# Patient Record
Sex: Female | Born: 1980 | Race: White | Hispanic: No | Marital: Married | Smoking: Never smoker
Health system: Southern US, Community
[De-identification: ages and names within clinical notes are randomized; demographics above are authoritative.]

## PROBLEM LIST (undated history)

## (undated) DIAGNOSIS — K921 Melena: Secondary | ICD-10-CM

## (undated) DIAGNOSIS — F32A Depression, unspecified: Secondary | ICD-10-CM

## (undated) DIAGNOSIS — Z820 Family history of epilepsy and other diseases of the nervous system: Secondary | ICD-10-CM

## (undated) DIAGNOSIS — F329 Major depressive disorder, single episode, unspecified: Secondary | ICD-10-CM

## (undated) DIAGNOSIS — F419 Anxiety disorder, unspecified: Secondary | ICD-10-CM

## (undated) DIAGNOSIS — N898 Other specified noninflammatory disorders of vagina: Secondary | ICD-10-CM

## (undated) HISTORY — PX: WISDOM TOOTH EXTRACTION: SHX21

## (undated) HISTORY — PX: TYMPANOSTOMY TUBE PLACEMENT: SHX32

## (undated) HISTORY — DX: Anxiety disorder, unspecified: F41.9

## (undated) HISTORY — DX: Family history of epilepsy and other diseases of the nervous system: Z82.0

## (undated) HISTORY — DX: Melena: K92.1

## (undated) HISTORY — DX: Depression, unspecified: F32.A

## (undated) HISTORY — DX: Other specified noninflammatory disorders of vagina: N89.8

## (undated) HISTORY — DX: Major depressive disorder, single episode, unspecified: F32.9

---

## 2009-07-26 DIAGNOSIS — N898 Other specified noninflammatory disorders of vagina: Secondary | ICD-10-CM

## 2009-07-26 DIAGNOSIS — K921 Melena: Secondary | ICD-10-CM

## 2009-07-26 HISTORY — DX: Other specified noninflammatory disorders of vagina: N89.8

## 2009-07-26 HISTORY — DX: Melena: K92.1

## 2009-08-12 ENCOUNTER — Telehealth: Payer: Self-pay | Admitting: Gastroenterology

## 2009-08-13 ENCOUNTER — Ambulatory Visit: Payer: Self-pay | Admitting: Nurse Practitioner

## 2009-08-13 ENCOUNTER — Encounter: Payer: Self-pay | Admitting: Obstetrics & Gynecology

## 2009-08-13 LAB — CONVERTED CEMR LAB
MCHC: 32.9 g/dL (ref 30.0–36.0)
MCV: 81.3 fL (ref 78.0–100.0)
Platelets: 334 10*3/uL (ref 150–400)
RBC: 4.97 M/uL (ref 3.87–5.11)
RDW: 12.6 % (ref 11.5–15.5)

## 2009-08-15 ENCOUNTER — Ambulatory Visit: Payer: Self-pay | Admitting: Obstetrics & Gynecology

## 2009-08-30 ENCOUNTER — Encounter (INDEPENDENT_AMBULATORY_CARE_PROVIDER_SITE_OTHER): Payer: Self-pay | Admitting: *Deleted

## 2009-08-30 ENCOUNTER — Ambulatory Visit: Payer: Self-pay | Admitting: Gastroenterology

## 2009-08-30 DIAGNOSIS — K625 Hemorrhage of anus and rectum: Secondary | ICD-10-CM

## 2009-08-30 DIAGNOSIS — R197 Diarrhea, unspecified: Secondary | ICD-10-CM | POA: Insufficient documentation

## 2009-09-02 ENCOUNTER — Ambulatory Visit: Payer: Self-pay | Admitting: Gastroenterology

## 2009-09-02 ENCOUNTER — Encounter (INDEPENDENT_AMBULATORY_CARE_PROVIDER_SITE_OTHER): Payer: Self-pay | Admitting: *Deleted

## 2009-09-02 LAB — CONVERTED CEMR LAB
ALT: 18 units/L (ref 0–35)
BUN: 10 mg/dL (ref 6–23)
Calcium: 9.4 mg/dL (ref 8.4–10.5)
Creatinine, Ser: 0.7 mg/dL (ref 0.4–1.2)
Eosinophils Relative: 2.2 % (ref 0.0–5.0)
Glucose, Bld: 88 mg/dL (ref 70–99)
Lymphocytes Relative: 25.6 % (ref 12.0–46.0)
MCV: 80 fL (ref 78.0–100.0)
Monocytes Absolute: 0.5 10*3/uL (ref 0.1–1.0)
Neutrophils Relative %: 64.9 % (ref 43.0–77.0)
Platelets: 357 10*3/uL (ref 150.0–400.0)
Potassium: 3.8 meq/L (ref 3.5–5.1)
Sodium: 138 meq/L (ref 135–145)
TSH: 1.51 microintl units/mL (ref 0.35–5.50)
WBC: 8.4 10*3/uL (ref 4.5–10.5)

## 2009-10-15 ENCOUNTER — Ambulatory Visit: Payer: Self-pay | Admitting: Gastroenterology

## 2010-03-11 ENCOUNTER — Ambulatory Visit: Payer: Self-pay | Admitting: Family Medicine

## 2010-04-15 ENCOUNTER — Ambulatory Visit: Payer: Self-pay | Admitting: Family Medicine

## 2010-05-27 NOTE — Progress Notes (Signed)
Summary: triage  Phone Note Call from Patient Call back at [phone]   Caller: Patient Call For: Christella Hartigan Reason for Call: Talk to Nurse Summary of Call: Patient wants to know if she can be seen sooner than her appt 5-13 do to severe constipation and wt loss Initial call taken by: Tawni Levy,  August 12, 2009 12:38 PM  Follow-up for Phone Call        pt is having constipation and bowels are moving everyother day which  is normal for her.  Stools are hard and she has seen some rectal bleeding with the hard stools.   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  These are very painful.  Appt made for 08/30/09 2:45pm Chales Abrahams CMA (AAMA)  August 12, 2009 12:50 PM   Additional Follow-up for Phone Call Additional follow up Details #1::        ok, thanks Additional Follow-up by: Rachael Fee MD,  August 12, 2009 1:46 PM

## 2010-05-27 NOTE — Letter (Signed)
Summary: Appointment Reminder  [facility]  [address] [place], [place] [address]   Phone: [phone]  Fax: [phone]        October 15, 2009 MRN: [id number]    [facility] [address] [place], [place]  [address]    Dear Ms. Ruffino,   We have been unable to reach you by phone to schedule a follow up   appointment that was recommended for you by Dr. ____. It is very   important that we reach you to schedule an appointment. We hope that you  allow [place] to participate in your health care needs. Please contact us at  [phone] at your earliest convenience to schedule your appointment.     Sincerely,    Rachael Fee MD

## 2010-05-27 NOTE — Letter (Signed)
Summary: Appt Reminder 2  [facility]  [address] [place], [place] [address]   Phone: [phone]  Fax: [phone]        Sep 02, 2009 MRN: [id number]    [facility] [address] [place], [place]  [address]    Dear Ms. Pirro,   You have a return appointment with Dr. Christella Hartigan on 10/15/09 at 845 am.  Please remember to bring a complete list of the medicines you are taking, your insurance card and your co-pay.  If you have to cancel or reschedule this appointment, please call before 5:00 pm the evening before to avoid a cancellation fee.  If you have any questions or concerns, please call [phone].    Sincerely,    Chales Abrahams CMA (AAMA)

## 2010-05-27 NOTE — Assessment & Plan Note (Signed)
History of Present Illness Visit Type: Initial Visit Primary GI MD: Rob Bunting MD Primary Provider: Irven Easterly, MD Chief Complaint: blood in stool History of Present Illness:     very pleasant 30 year old woman who has noted blood in her stool.  She has irregularly irregular bowel habits. She saw a proctologist 10 years ago.  She sees blood in stool about every time she has a BM.  Usually has to push and strain to have a BM.  Will have  a BM every 2-3 days.  Blood can be dripping.    definitely had anal fissures, hemorrhoids, even as far back as her teens.  Can have very sore, swollen anus.   Has not tried regular fiber supplements, or stool softners.   Has not tried OTC creams, ointments.  she has noticed unusual lesions on her shins like bruises cannot explain. She has had no eye symptoms. She does have intermittent pains in her belly especially on the right lower quadrant.           Current Medications (verified): 1)  None  Allergies (verified): 1)  ! Iodine  Past History:  Past Medical History: anal fissuring Anemia  Past Surgical History: ear surgeries 3 times C-Section 3 times  Family History: no Crohn's disease  Social History: she is married, she has 3 sons, she is a Architectural technologist, she does not smoke cigarettes or drink alcohol, she drinks about 3 caffeinated beverages per day  Review of Systems       Pertinent positive and negative review of systems were noted in the above HPI and GI specific review of systems.  All other review of systems was otherwise negative.   Vital Signs:  Patient profile:   30 year old female Height:      66 inches Weight:      137.38 pounds BMI:     22.25 Pulse rate:   60 / minute Pulse rhythm:   regular BP sitting:   110 / 60  (left arm) Cuff size:   regular  Vitals Entered By: June McMurray CMA Duncan Dull) (Aug 30, 2009 2:39 PM)  Physical Exam  Additional Exam:  Constitutional: generally well  appearing Psychiatric: alert and oriented times 3 Eyes: extraocular movements intact Mouth: oropharynx moist, no lesions Neck: supple, no lymphadenopathy Cardiovascular: heart regular rate and rythm Lungs: CTA bilaterally Abdomen: soft, non-tender, non-distended, no obvious ascites, no peritoneal signs, normal bowel sounds Extremities: no lower extremity edema bilaterally Skin: no lesions on visible extremities anorectal examination deferred for upcoming colonoscopy   Impression & Recommendations:  Problem # 1:  history of anal fissuring, intermittent rectal bleeding, ?Crohn's she is mildly tender in the right lower quadrant, she has a history of anal fissuring disease, he sees blood with normally every stool. She has had some unusual bruise-like lesions on her shins, I'm suspicious that she may indeed have underlying inflammatory bowel disease such as Crohn's disease. She'll get basic set of labs including a CBC, complete metabolic profile, sedimentation rate. We will arrange for a colonoscopy at her soonest convenience. In the meantime since she tends to be rather constipated have a tough time moving her bowels, she'll try Citrucel on a daily basis.  Other Orders: TLB-CBC Platelet - w/Differential (85025-CBCD) TLB-CMP (Comprehensive Metabolic Pnl) (80053-COMP) TLB-TSH (Thyroid Stimulating Hormone) (84443-TSH) TLB-Sedimentation Rate (ESR) (85652-ESR)  Patient Instructions: 1)  You should begin taking citrucel powder fiber supplement (orange flavor).  Start with a small spoonful and increase this over 1 week  to a full, heaping spoonful daily.  You may notice some bloating when you first start the fiber, but that usually resolves after a few days. 2)  You will be scheduled to have a colonoscopy. 3)  You will get lab test(s) done today (cbc, cmet, tsh, esr). 4)  A copy of this information will be sent to Dr. Thana Ates. 5)  The medication list was reviewed and reconciled.  All changed /  newly prescribed medications were explained.  A complete medication list was provided to the patient / caregiver.  Appended Document: Orders Update/Movi    Clinical Lists Changes  Medications: Added new medication of MOVIPREP 100 GM  SOLR (PEG-KCL-NACL-NASULF-NA ASC-C) As per prep instructions. - Signed Rx of MOVIPREP 100 GM  SOLR (PEG-KCL-NACL-NASULF-NA ASC-C) As per prep instructions.;  #1 x 0;  Signed;  Entered by: Chales Abrahams CMA (AAMA);  Authorized by: Rachael Fee MD;  Method used: [facility]  [address] [place], [place]  [address], Ph: [phone] or [phone], Fax: [phone] Orders: Added new Test order of Colonoscopy (Colon) - Signed    Prescriptions: MOVIPREP 100 GM  SOLR (PEG-KCL-NACL-NASULF-NA ASC-C) As per prep instructions.  #1 x 0   Entered by:   Chales Abrahams CMA (AAMA)   Authorized by:   Rachael Fee MD   Signed by:   Chales Abrahams CMA (AAMA) on 08/30/2009   Method used:   [facility]        [facility]  [address] (retail)       [address]       [place], [place]  [address]       Ph: [phone] or [phone]       Fax: [phone]   RxID:   [id number]

## 2010-05-27 NOTE — Assessment & Plan Note (Signed)
  Review of gastrointestinal problems: 1. 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 2011: CBC, complete metabolic profile, sedimentation rate were all normal.    History of Present Illness Visit Type: Follow-up Visit Primary GI MD: Rob Bunting MD Primary Provider: Irven Easterly, MD Chief Complaint: F/u colonoscopy History of Present Illness:     very pleasant woman whom I last saw at the time of a colonoscopy last month. See those results summarized above.  has seen no  blood in stool since colonoscopy.  She has been taking benefiber, she is not straining.  She will sometimes back off on it.  She is happy with the changes. She can still  have cramps in lower abdomen.   She is on prednisone now for poison oak on all of her extremities             Current Medications (verified): 1)  Prednisone 2.5 Mg Tabs (Prednisone) .... Take As Directed 2)  Claritin 10 Mg Tabs (Loratadine) .... Once Daily 3)  Benadryl Dye-Free Allergy 25 Mg Caps (Diphenhydramine Hcl) .... At Bedtime 4)  Benefiber  Powd (Wheat Dextrin) .... Once Daily  Allergies (verified): 1)  ! Iodine  Vital Signs:  Patient profile:   30 year old female Height:      66 inches Weight:      142.25 pounds BMI:     23.04 Pulse rate:   72 / minute Pulse rhythm:   regular BP sitting:   110 / 68  (left arm) Cuff size:   regular  Vitals Entered By: June McMurray CMA Duncan Dull) (October 15, 2009 8:59 AM)  Physical Exam  Additional Exam:  Constitutional: generally well appearing Psychiatric: alert and oriented times 3 Abdomen: soft, non-tender, non-distended, normal bowel sounds    Impression & Recommendations:  Problem # 1:  constipation predominant IBS, mild intermittent hemorrhoidal bleeding her bowel symptoms have much improved since starting fiber supplements on a daily basis. She will continue them for now. She is  still bothered by mild cramping that is always relieved when she moves her bowels or passes flatus. I have called her in a prescription for sublingual antispasmodics. She will return to see me on an as-needed basis.  Patient Instructions: 1)  A prescription for antispasmodics was given.  Take one to two as needed for cramps. 2)  Stay on fiber supplements. 3)  Return to see Dr. Christella Hartigan as needed. 4)  The medication list was reviewed and reconciled.  All changed / newly prescribed medications were explained.  A complete medication list was provided to the patient / caregiver. Prescriptions: LEVSIN/SL 0.125 MG  SUBL (HYOSCYAMINE SULFATE) take one to two every 5-6 hours as needed  #75 x 4   Entered and Authorized by:   Rachael Fee MD   Signed by:   Rachael Fee MD on 10/15/2009   Method used:   [facility]        [facility]  [address] [facility]       [address]       [place], [place]  [address]       Ph: [phone] or [phone]       Fax: [phone]   RxID:   [id number]

## 2010-05-27 NOTE — Procedures (Signed)
Summary: Colonoscopy  Patient: Chivonne Rascon Note: All result statuses are Final unless otherwise noted.  Tests: (1) Colonoscopy (COL)   COL Colonoscopy           DONE     [facility]     [address]     [place], [place]  [address]           COLONOSCOPY PROCEDURE REPORT           PATIENT:  Christalynn, [place]  MR#:  [id number]     BIRTHDATE:  06-Jan-1981, 28 yrs. old  GENDER:  female     ENDOSCOPIST:  Rachael Fee, MD     REF. BY:  Dennison Mascot, M.D.     PROCEDURE DATE:  09/02/2009     PROCEDURE:  Diagnostic Colonoscopy     ASA CLASS:  Class I     INDICATIONS:  constipation, rectal bleeding, h/o anal fissuring     MEDICATIONS:   Fentanyl 50 mcg IV, Versed 5 mg IV           DESCRIPTION OF PROCEDURE:   After the risks benefits and     alternatives of the procedure were thoroughly explained, informed     consent was obtained.  Digital rectal exam was performed and     revealed no rectal masses.   The LB [id number] endoscope     was introduced through the anus and advanced to the terminal ileum     which was intubated for a short distance, without limitations.     The quality of the prep was poor, using MoviPrep.  The instrument     was then slowly withdrawn as the colon was fully examined.     <<PROCEDUREIMAGES>>           FINDINGS:  The terminal ileum appeared normal (see image2).  A     normal appearing cecum, ileocecal valve, and appendiceal orifice     were identified. The ascending, hepatic flexure, transverse,     splenic flexure, descending, sigmoid colon, and rectum appeared     unremarkable (see image1, image3, and image5).  External     hemorrhoids were found. These were small, not thrombosed.     Retroflexed views in the rectum revealed no abnormalities.    The     scope was then withdrawn from the patient and the procedure     completed.     COMPLICATIONS:  None           ENDOSCOPIC IMPRESSION:     Normal examination to the terminal ileum.  No  sign of colitis,     Crohn's disease.     Small external hemorrhoids.           RECOMMENDATIONS:     Continue daily citrucel fiber supplement. This should help with     constipation/straining and in turn help resolve the hemorrhoids.     Dr. Christella Hartigan office will call to arrange follow up visit in 6-7     weeks.           ______________________________     Rachael Fee, MD           n.     eSIGNED:   Rachael Fee at 09/02/2009 04:03 PM           [name], [id number]  Note: An exclamation mark (!) indicates a result that was not dispersed into the flowsheet. Document Creation Date: 09/02/2009 4:04 PM _______________________________________________________________________  Marland Kitchen  1) Order result status: Final Collection or observation date-time: 09/02/2009 15:57 Requested date-time:  Receipt date-time:  Reported date-time:  Referring Physician:   Ordering Physician: Rob Bunting [phone]) Specimen Source:  Source: Launa Grill Order Number: [id number] Lab site:   Appended Document: Colonoscopy appt made and letter mailed

## 2010-05-27 NOTE — Letter (Signed)
Summary: [facility] Instructions  [facility]  [address] [place], [place] [address]   Phone: [phone]  Fax: [phone]       Angela Andrews    [birth year]    MRN: [id number]        Procedure Day /Date:09/02/09     Arrival Time:3 pm     Procedure Time:4 pm     Location of Procedure:                    X  [facility] (4th Floor)                        PREPARATION FOR COLONOSCOPY WITH MOVIPREP   Starting 5 days prior to your procedure 08/28/09 do not eat nuts, seeds, popcorn, corn, beans, peas,  salads, or any raw vegetables.  Do not take any fiber supplements (e.g. Metamucil, Citrucel, and Benefiber).  THE DAY BEFORE YOUR PROCEDURE         DATE:09/01/09  DAY: SUN  1.  Drink clear liquids the entire day-NO SOLID FOOD  2.  Do not drink anything colored red or purple.  Avoid juices with pulp.  No orange juice.  3.  Drink at least 64 oz. (8 glasses) of fluid/clear liquids during the day to prevent dehydration and help the prep work efficiently.  CLEAR LIQUIDS INCLUDE: Water Jello Ice Popsicles Tea (sugar ok, no milk/cream) Powdered fruit flavored drinks Coffee (sugar ok, no milk/cream) Gatorade Juice: apple, white grape, white cranberry  Lemonade Clear bullion, consomm, broth Carbonated beverages (any kind) Strained chicken noodle soup Hard Candy                             4.  In the morning, mix first dose of MoviPrep solution:    Empty 1 Pouch A and 1 Pouch B into the disposable container    Add lukewarm drinking water to the top line of the container. Mix to dissolve    Refrigerate (mixed solution should be used within 24 hrs)  5.  Begin drinking the prep at 5:00 p.m. The MoviPrep container is divided by 4 marks.   Every 15 minutes drink the solution down to the next mark (approximately 8 oz) until the full liter is complete.   6.  Follow completed prep with 16 oz of clear liquid of your choice (Nothing red or purple).  Continue to  drink clear liquids until bedtime.  7.  Before going to bed, mix second dose of MoviPrep solution:    Empty 1 Pouch A and 1 Pouch B into the disposable container    Add lukewarm drinking water to the top line of the container. Mix to dissolve    Refrigerate  THE DAY OF YOUR PROCEDURE      DATE: 09/02/09 DAY: MON  Beginning at 11 a.m. (5 hours before procedure):         1. Every 15 minutes, drink the solution down to the next mark (approx 8 oz) until the full liter is complete.  2. Follow completed prep with 16 oz. of clear liquid of your choice.    3. You may drink clear liquids until 2 pm (2 HOURS BEFORE PROCEDURE).   MEDICATION INSTRUCTIONS  Unless otherwise instructed, you should take regular prescription medications with a small sip of water   as early as possible the morning of your procedure.  OTHER INSTRUCTIONS  You will need a responsible adult at least 30 years of age to accompany you and drive you home.   This person must remain in the waiting room during your procedure.  Wear loose fitting clothing that is easily removed.  Leave jewelry and other valuables at home.  However, you may wish to bring a book to read or  an iPod/MP3 player to listen to music as you wait for your procedure to start.  Remove all body piercing jewelry and leave at home.  Total time from sign-in until discharge is approximately 2-3 hours.  You should go home directly after your procedure and rest.  You can resume normal activities the  day after your procedure.  The day of your procedure you should not:   Drive   Make legal decisions   Operate machinery   Drink alcohol   Return to work  You will receive specific instructions about eating, activities and medications before you leave.    The above instructions have been reviewed and explained to me by   _______________________    I fully understand and can verbalize these instructions  _____________________________ Date _________

## 2010-09-09 NOTE — Assessment & Plan Note (Signed)
NAME:  Angela Andrews, Angela Andrews               ACCOUNT NO.:  [id number]   MEDICAL RECORD NO.:  [id number]          PATIENT TYPE:  POB   LOCATION:  [facility]         FACILITY:  [facility]   PHYSICIAN:  Jaynie Collins, MD     DATE OF BIRTH:  Mar 22, 1981   DATE OF SERVICE:  08/15/2009                                  CLINIC NOTE   Mirena IUD insertion.   NOTE:  The patient is a 30 year old gravida 3, para 3 who is here today  for Mirena IUD placement.  She did have her annual examination on August 13, 2009, at which point, a Pap smear was done and a reflex gonorrhea  and Chlamydia were also done with that Pap, but the results are not  available at this visit.  The patient has been counseled about the risks  and benefits of her Mirena IUD insertion and side effects have been  reviewed in detail with her and all her questions were answered.  Written informed consent was obtained.  A urine pregnancy test that was  done was negative.   The vaginal speculum was placed and her cervix was visualized and  cleaned with Betadine x3.  A tenaculum was placed on the anterior lip of  her cervix and the uterus was sounded to 9 cm.  The Mirena IUD was then  inserted into the fundal cavity without any difficulty and the strings  were cut to about 3 cm outside the external os.  The tenaculum was  removed.  There was some bleeding at the tenaculum site which was  alleviated using pressure and also silver nitrate application.  All  instruments were then removed from the patient's vagina.  The patient  tolerated the procedure well.  Routine postprocedure instructions were  given to the patient.  She will follow up in 4-6 weeks for her IUD  check.           ______________________________  Jaynie Collins, MD     UA/MEDQ  D:  08/15/2009  T:  08/16/2009  Job:  [id number]

## 2010-09-09 NOTE — Assessment & Plan Note (Signed)
NAME:  Angela Andrews, Angela Andrews               ACCOUNT NO.:  [id number]   MEDICAL RECORD NO.:  [id number]          PATIENT TYPE:  POB   LOCATION:  [facility]         FACILITY:  [facility]   PHYSICIAN:  Tinnie Gens, MD        DATE OF BIRTH:  [birth year]   DATE OF SERVICE:  03/11/2010                                  CLINIC NOTE   CHIEF COMPLAINT:  IUD check.   HISTORY OF PRESENT ILLNESS:  The patient is a 30 year old gravida 3,  para 3 who is status post Mirena IUD placement in April of this year.  She has had increasing complaints of back pain and mood swings and  crying for no good reason, weight gain.  She is wondering if it is  related to her IUD.  She is a very happy with her IUD.  She has not  really had any heavy bleeding.  Her cycles are lighter that she does not  get with her birth control.  They are not thinking they want any more  children she and her husband; however, she is unwilling to make  permanent decision at this time.  She would like to continue the IUD,  however.  She has recently moved here.  They have no family.  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  She has a 60 and 7-year-  old who are in school and an almost 36-year-old, who is in home with her  all the time.  She is having more and more difficulty getting out of  house sometimes and she is getting irritable with her kids.   PHYSICAL EXAMINATION:  VITAL SIGNS:  As noted in the chart.  Her weight  is 150 pounds.  General:  She is a well-developed, well-nourished female, in no acute  stress.  ABDOMEN:  Soft, nontender, nondistended.  GU:  Deferred.   IMPRESSION:  After a lengthy consultation was held with the patient, I  think some of this may be stress related and maybe depression affected  as well.  I did advise to continue exercising, consider dropping daycare  or play day, so she has some more adult conversation, also may consider  going back to work.  Additionally, we are going to have a  trial of  Wellbutrin XL 150 mg p.o. daily for 3 days and increase to 300 mg daily  after that and see if that works for her for the next 4-6 weeks before  definitively removing her intrauterine device.  The patient also  complains of her husband feeling strings.  We would consider after 6  months possibly trimming her strings.  She also reports using Astroglide  for sexual intercourse; however, she continues to have trouble with  burning after intercourse when she frequently gets into the bath.  I did  advise against bathing and increasing the amount of Astroglide, and  foreplay, and making sure she has adequate natural lubrication.  We will  see the patient back in 4 to 6 weeks to see how she is doing.           ______________________________  Tinnie Gens, MD  TP/MEDQ  D:  03/11/2010  T:  03/12/2010  Job:  [id number]

## 2010-09-09 NOTE — Assessment & Plan Note (Signed)
NAME:  Angela Andrews, BRUTUS               ACCOUNT NO.:  [id number]   MEDICAL RECORD NO.:  [id number]          PATIENT TYPE:  POB   LOCATION:  [facility]         FACILITY:  [facility]   PHYSICIAN:  Tinnie Gens, MD        DATE OF BIRTH:  13-Sep-1980   DATE OF SERVICE:  04/15/2010                                  CLINIC NOTE   CHIEF COMPLAINT:  Followup.   HISTORY OF PRESENT ILLNESS:  The patient is a 30 year old gravida 3,  para 3 who was seen for IUD recheck.  At that time, she was feeling that  her IUD was making her depressed; however, we were not sure.  It seemed  like she was just having stressful time in her life and she has been  home with her kids and not necessarily having a lot of adult time and  that is why she was saying so.  We started her on Wellbutrin and she has  been taking this regularly and she reports feeling much better, that her  mood swings have stopped.  She is no really crying anymore and she  thinks it is working pretty well for her.  She is complaining of new  onset headaches.  She has had headaches in the past, but maybe, they  have become a little more regular since starting the Wellbutrin;  however, she is unwilling to do anything else.  She just like something  better for headache.  She has tried ibuprofen and Excedrin Migraine  without significant relief.   PHYSICAL EXAMINATION:  VITAL SIGNS:  As noted in the chart.  GENERAL:  She is a well-developed, well-nourished female in no acute  distress.  ABDOMEN:  Soft, nontender, nondistended.  NEUROLOGIC:  Affect is improved.  There is no crying during this visit  today.  She makes good eye contact and answers all questions  appropriately.   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   PLAN:  We will continue Wellbutrin for now, which may be because of her  headaches.   For headaches, I have given diclofenac and see how this does.  If this  does not work, she can call back  and we will try something different for  headache.  The patient will continue her Wellbutrin SR and let me know  when she needs to come off and we will try to taper down slowly versus  continuing this medication.           ______________________________  Tinnie Gens, MD     TP/MEDQ  D:  04/15/2010  T:  04/15/2010  Job:  [id number]

## 2010-09-09 NOTE — Assessment & Plan Note (Signed)
NAME:  Angela Andrews, Angela Andrews               ACCOUNT NO.:  [id number]   MEDICAL RECORD NO.:  [id number]          PATIENT TYPE:  POB   LOCATION:  [facility]         FACILITY:  [facility]   PHYSICIAN:  Jaynie Collins, MD     DATE OF BIRTH:  1980/06/28   DATE OF SERVICE:  08/13/2009                                  CLINIC NOTE   The patient comes to office today as a new patient for her yearly Pap  and pelvic exam.  The patient has not had any abnormal Pap smears in her  lifetime.  She is a G3, P3.  She is currently using condoms for birth  control 100% of the time.  She does have an appointment on Thursday to  have a Mirena IUD placed.  She does not have a history of any STDs in  the past.  She has had 3 C-sections in the past, the first C-section was  performed due to a failure to dilate.  She has recently noticed that she  has had thick white vaginal discharge that has been itchy.  She did last  week by some over-the-counter Monistat and that was somewhat helpful.  She is currently married in a sexually faithful relationship.   SURGICAL HISTORY:  She has had ear surgery in 1999, 2000, 2002.   FAMILY HISTORY:  Diabetes in father, high blood pressure in father, and  grandfather, cancer.   PERSONAL HISTORY:  Negative.   SOCIAL HISTORY:  The patient lives at home with her husband and her 3  children.  She does not work outside of the home.  She does not smoke  cigarettes.  She does not drink alcohol.  She does drink caffeinated  beverages in fact.  She admits to drinking sweet ice tea all day long.  She denies any history of abuse.   REVIEW OF SYSTEMS:  Positive for bruising, blood in stool, and vaginal  itching.   PHYSICAL EXAMINATION:  VITAL SIGNS:  Blood pressure is 118/91, repeat is  120/62, pulse is 127, weight 137, height is 5 feet 7 inches.  GENERAL:  Well-developed, well-nourished 30 year old Caucasian female,  in no acute distress.  HEENT:  Head is normocephalic and  atraumatic.  Pupils equal and react.  Her thyroid is without enlargement or nodules.  CARDIAC:  Regular rate and rhythm.  She is not tachycardiac at this  time.  LUNGS:  Clear bilaterally.  BREASTS:  Negative for skin dimpling or any mass.  ABDOMEN:  Soft, nontender with no organomegaly.  GENITALIA:  Externally, no lesions or discharges.  Internally, there is  a thick clubbish white discharge.  Cervix is without lesions.  Bimanual  exam, no cervical motion tenderness.  No adnexal mass.  EXTREMITIES:  Warm and dry.   ASSESSMENT:  1. Yearly exam.  We did her Pap smear today.  We will include a GC      Chlamydia with that prior to the Mirena being inserted.  2. Contraception.  The patient will have a Mirena IUD placed on      Thursday.  She is to continue to use condoms until then.  3. Likely vaginal yeast.  The patient was  given a prescription for      Diflucan 100 mg 1 time dose.  She can have 2 refills on that.  4. Blood in stool.  The patient has an appointment to see [facility]      specialist in May.  5. Menstrual cramping.  The patient was given a prescription for      Motrin 800 mg 1 p.o. t.i.d. #60 with 1 refill.  The patient again      will return on Thursday for her Mirena IUD.  She will return for      her Pap smear in 1 year.      Remonia Richter, NP    ______________________________  Jaynie Collins, MD    LR/MEDQ  D:  08/13/2009  T:  08/14/2009  Job:  [id number]

## 2011-01-28 ENCOUNTER — Encounter: Payer: Self-pay | Admitting: Obstetrics and Gynecology

## 2011-01-28 ENCOUNTER — Ambulatory Visit (INDEPENDENT_AMBULATORY_CARE_PROVIDER_SITE_OTHER): Payer: Private Health Insurance - Indemnity | Admitting: Obstetrics and Gynecology

## 2011-01-28 VITALS — BP 118/75 | HR 86 | Ht 67.0 in | Wt 151.0 lb

## 2011-01-28 DIAGNOSIS — Z1272 Encounter for screening for malignant neoplasm of vagina: Secondary | ICD-10-CM

## 2011-01-28 DIAGNOSIS — Z23 Encounter for immunization: Secondary | ICD-10-CM

## 2011-01-28 DIAGNOSIS — F329 Major depressive disorder, single episode, unspecified: Secondary | ICD-10-CM

## 2011-01-28 DIAGNOSIS — Z01419 Encounter for gynecological examination (general) (routine) without abnormal findings: Secondary | ICD-10-CM

## 2011-01-28 DIAGNOSIS — Z309 Encounter for contraceptive management, unspecified: Secondary | ICD-10-CM

## 2011-01-28 DIAGNOSIS — Z30432 Encounter for removal of intrauterine contraceptive device: Secondary | ICD-10-CM

## 2011-01-28 DIAGNOSIS — Z3049 Encounter for surveillance of other contraceptives: Secondary | ICD-10-CM

## 2011-01-28 MED ORDER — MEDROXYPROGESTERONE ACETATE 150 MG/ML IM SUSP
150.0000 mg | INTRAMUSCULAR | Status: AC
  Administered 2011-01-28 – 2011-08-20 (×2): 150 mg via INTRAMUSCULAR

## 2011-01-28 NOTE — Progress Notes (Signed)
Addended by: Barbara Cower on: 01/28/2011 12:04 PM   Modules accepted: Orders

## 2011-01-28 NOTE — Patient Instructions (Addendum)
What is this medicine? MEDROXYPROGESTERONE (me DROX ee proe JES te rone) contraceptive injections prevent pregnancy. They provide effective birth control for 3 months. Depo-subQ Provera 104 is also used for treating pain related to endometriosis. This medicine may be used for other purposes; ask your health care provider or pharmacist if you have questions.   What should I tell my health care provider before I take this medicine? They need to know if you have any of these conditions: -frequently drink alcohol -asthma -blood vessel disease or a history of a blood clot in the lungs or legs -bone disease such as osteoporosis -breast cancer -diabetes -eating disorder (anorexia nervosa or bulimia) -high blood pressure -HIV infection or AIDS -kidney disease -liver disease -mental depression -migraine -seizures (convulsions) -stroke -tobacco smoker -vaginal bleeding -an unusual or allergic reaction to medroxyprogesterone, other hormones, medicines, foods, dyes, or preservatives -pregnant or trying to get pregnant -breast-feeding   How should I use this medicine? Depo-Provera Contraceptive injection is given into a muscle. Depo-subQ Provera 104 injection is given under the skin. These injections are given by a health care professional. You must not be pregnant before getting an injection. The injection is usually given during the first 5 days after the start of a menstrual period or 6 weeks after delivery of a baby. Talk to your pediatrician regarding the use of this medicine in children. Special care may be needed. These injections have been used in female children who have started having menstrual periods. Overdosage: If you think you have taken too much of this medicine contact a poison control center or emergency room at once. NOTE: This medicine is only for you. Do not share this medicine with others.   What if I miss a dose? Try not to miss a dose. You must get an injection once  every 3 months to maintain birth control. If you cannot keep an appointment, call and reschedule it. If you wait longer than 13 weeks between Depo-Provera contraceptive injections or longer than 14 weeks between Depo-subQ Provera 104 injections, you could get pregnant. Use another method for birth control if you miss your appointment. You may also need a pregnancy test before receiving another injection.   What may interact with this medicine? Do not take this medicine with any of the following medications: -bosentan   This medicine may also interact with the following medications: -aminoglutethimide -antibiotics or medicines for infections, especially rifampin, rifabutin, rifapentine, and griseofulvin -aprepitant -barbiturate medicines such as phenobarbital or primidone -bexarotene -carbamazepine -medicines for seizures like ethotoin, felbamate, oxcarbazepine, phenytoin, topiramate -modafinil -St. John's wort   This list may not describe all possible interactions. Give your health care provider a list of all the medicines, herbs, non-prescription drugs, or dietary supplements you use. Also tell them if you smoke, drink alcohol, or use illegal drugs. Some items may interact with your medicine.   What should I watch for while using this medicine? This drug does not protect you against HIV infection (AIDS) or other sexually transmitted diseases.   Use of this product may cause you to lose calcium from your bones. Loss of calcium may cause weak bones (osteoporosis). Only use this product for more than 2 years if other forms of birth control are not right for you. The longer you use this product for birth control the more likely you will be at risk for weak bones. Ask your health care professional how you can keep strong bones.   You may have a change in bleeding pattern  or irregular periods. Many females stop having periods while taking this drug.   If you have received your injections on  time, your chance of being pregnant is very low. If you think you may be pregnant, see your health care professional as soon as possible.   Tell your health care professional if you want to get pregnant within the next year. The effect of this medicine may last a long time after you get your last injection.   What side effects may I notice from receiving this medicine? Side effects that you should report to your doctor or health care professional as soon as possible: -allergic reactions like skin rash, itching or hives, swelling of the face, lips, or tongue -breast tenderness or discharge -breathing problems -changes in vision -depression -feeling faint or lightheaded, falls -fever -pain in the abdomen, chest, groin, or leg -problems with balance, talking, walking -unusually weak or tired -yellowing of the eyes or skin   Side effects that usually do not require medical attention (report to your doctor or health care professional if they continue or are bothersome): -acne -fluid retention and swelling -headache -irregular periods, spotting, or absent periods -temporary pain, itching, or skin reaction at site where injected -weight gain   This list may not describe all possible side effects. Call your doctor for medical advice about side effects. You may report side effects to FDA at 1-800-FDA-1088.   Where should I keep my medicine? This does not apply. The injection will be given to you by a health care professional.   NOTE:This sheet is a summary. It may not cover all possible information. If you have questions about this medicine, talk to your doctor, pharmacist, or health care provider.      2011, Elsevier/Gold Standard.   Preventative Care for Adults - Female Studies show that half of deaths in the [place] today result from unhealthy lifestyle practices. This includes ignoring preventive care suggestions. Preventive health guidelines for women include the following key  practices:  A routine yearly physical is a good way to check with your primary caregiver about your health and preventive screening. It is a chance to share any concerns and updates on your health, and to receive a thorough all-over exam.   If you smoke cigarettes, find out from your caregiver how to quit. It can literally save your life, no matter how long you have been a tobacco user. If you do not use tobacco, never start.   Maintain a healthy diet and normal weight. Increased weight leads to problems with blood pressure and diabetes. Decrease saturated fat in your diet and increase regular exercise. Eat a variety of foods, including fruit, vegetables, animal or vegetable protein (meat, fish, chicken, and eggs, or beans, lentils, and tofu), and grains, such as rice. Get information about proper diet from your caregiver, if needed.   Aerobic exercise helps maintain good heart health. The CDC and the [employer] of Sports Medicine recommend 30 minutes of moderate-intensity exercise (a brisk walk that increases your heart rate and breathing) on most days of the week. Ongoing high blood pressure should be treated with medicines, if weight loss and exercise are not effective.   Avoid smoking, drinking too much alcohol (more than two drinks per day), and use of street drugs. Do not share needles with anyone. Ask for professional help if you need support or instructions about stopping the use of alcohol, cigarettes, or drugs.   Maintain normal blood lipids and cholesterol, by minimizing  your intake of saturated fat. Eat a well rounded diet, with plenty of fruit and vegetables. The [employer] of Health encourage women to eat 5-9 servings of fruit and vegetables each day. Your caregiver can give instructions to help you keep your risk of heart disease or stroke low. High blood pressure causes heart disease and increases risk of stroke. Blood pressure should be checked every 1-2 years, from age 3  onward.   Blood tests for high cholesterol, which causes heart and vessel disease, should begin at age 41 and be repeated every 5 years, if test results are normal. (Repeat tests more often if results are high.)   Diabetes screening involves taking a blood sample to check your blood sugar level, after a fasting period. This is done once every 3 years, after age 78, if test results are normal.   Breast cancer screening is essential to preventive care for women. All women age 37 and older should perform a breast self-exam every month. At age 7 and older, women should have their caregiver complete a breast exam each year. Women at ages 75-50 should have a mammogram (x-ray film) of the breasts each year. Your caregiver can discuss when to start your yearly mammograms.   Cervical cancer screening includes taking a Pap smear (sample of cells examined under a microscope) from the cervix (end of the uterus). It also includes testing for HPV (Human Papilloma Virus, which can cause cervical cancer). Screening and a pelvic exam should begin at age 38, or 3 years after a woman becomes sexually active. Screening should occur every year, with a Pap smear but no HPV testing, up to age 44. After age 11, you should have a Pap smear every 3 years with HPV testing, if no HPV was found previously.   Colon cancer can be detected, and often prevented, long before it is life threatening. Most routine colon cancer screening begins at the age of 19. On a yearly basis, your caregiver may provide easy-to-use take-home tests to check for hidden blood in the stool. Use of a small camera at the end of a tube, to directly examine the colon (sigmoidoscopy or colonoscopy), can detect the earliest forms of colon cancer and can be life saving. Talk to your caregiver about this at age 38, when routine screening begins. (Screening is repeated every 5 years, unless early forms of pre-cancerous polyps or small growths are found.)   Practice  safe sex. Use condoms. Condoms are used for birth control and to reduce the spread of sexually transmitted infections (STIs). Unsafe sex is sexual activity without the use of safeguards, such as condoms and avoidance of high-risk acts, to reduce the chances of getting or spreading STIs. STIs include gonorrhea (the clap), chlamydia, syphilis, trichimonas, herpes, HPV (human papilloma virus) and HIV (human immunodeficiency virus), which causes AIDS. Herpes, HIV, and HPV are viral illnesses that have no cure. They can result in disability, cancer, and death.   HPV causes cancer of the cervix, and other infections that can be transmitted from person to person. There is a vaccine for HPV, and females should get immunized between the ages of 87 and 33. It requires a series of 3 shots.   Osteoporosis is a disease in which the bones lose minerals and strength as we age. This can result in serious bone fractures. Risk of osteoporosis can be identified using a bone density scan. Women ages 32 and over should discuss this with their caregivers, as should women after menopause  who have other risk factors. Ask your caregiver whether you should be taking a calcium supplement and Vitamin D, to reduce the rate of osteoporosis.   Menopause can be associated with physical symptoms and risks. Hormone replacement therapy is available to decrease these. You should talk to your caregiver about whether starting or continuing to take hormones is right for you.   Use sunscreen with SPF (skin protection factor) of 15 or more. Apply sunscreen liberally and repeatedly throughout the day. Being outside in the sun, when your shadow is shorter than you are, means you are being exposed to sun at greater intensity. Lighter skinned people are at a greater risk of skin cancer. Wear sunglasses, to protect your eyes from too much damaging sunlight (which can speed up cataract formation).   Once a month, do a whole body skin exam or review,  using a mirror to look at your back. Notify your caregiver of changes in moles, especially if there are changes in shapes, colors, irregular border, a size larger than a pencil eraser, or new moles develop.   Keep carbon monoxide and smoke detectors in your home, and functioning, at all times. Change the batteries every 6 months, or use a model that plugs into the wall.   Stay up to date with your tetanus shots and other required immunizations. A booster for tetanus should be given every 10 years. Be sure to get your flu shot every year, since 5%-20% of the [place] population comes down with the flu. The composition of the flu vaccine changes each year, so being vaccinated once is not enough. Get your shot in the fall, before the flu season peaks. The table below lists important vaccines to get. Other vaccines to consider include for Hepatitis A virus (to prevent a form of infection of the liver, by a virus acquired from food), Varicella Zoster (a virus that causes shingles), and Meninogoccal (against bacteria which cause a form of meningitis).   Brush your teeth twice a day with fluoride toothpaste, and floss once a day. Good oral hygiene prevents tooth decay and gum disease, which can be painful and can cause other health problems. Visit your dentist for a routine oral and dental check up and preventive care every 6-12 months.   The Body Mass Index or BMI is a way of measuring how much of your body is fat. Having a BMI above 27 increases the risk of heart disease, diabetes, hypertension, stroke and other problems related to obesity. Your caregiver can help determine your BMI, and can develop an exercise and dietary program to help you achieve or maintain this measurement at a healthy level.   Wear seat belts whenever you are in a vehicle, whether as passenger or driver, and even for short drives of a few minutes.   If you bicycle, wear a helmet at all times.  Preventative Care for Adult Women    Preventative Services Ages 55-39 Ages 26-64 Ages 31 and over  Health risk assessment and lifestyle counseling.     Blood pressure check.** Every 1-2 years Every 1-2 years Every 1-2 years  Total cholesterol check including HDL.** Every 5 years beginning at age 70 Every 5 years beginning at age 31, or more often if risk is high Every 5 years through age 58, then optional  Breast self exam. Monthly in all women ages 53 and older Monthly Monthly  Clinical breast exam.** Every 3 years beginning at age 48 Every year Every year  Mammogram.**  Every  year beginning at age 69, optional from age 41-49 (discuss with your caregiver). Every year until age 51, then optional  Pap Smear** and HPV Screening. Every year from ages 78 through 50 Every 3 years from ages 24 through 67, if HPV is negative Optional; talk with your caregiver  Flexible sigmoidoscopy** or colonoscopy.**   Every 5 years beginning at age 28 Every 5 years until age 43; then optional  FOBT (fecal occult blood test) of stool.  Every year beginning at age 24 Every year until 51; then optional  Skin self-exam. Monthly Monthly Monthly  Tetanus-diphtheria (Td) immunization. Every 10 years Every 10 years Every 10 years  Influenza immunization.** Every year Every year Every year  HPV immunization. Once between the ages of 61 and 81     Pneumococcal immunization.** Optional Optional Every 5 years  Hepatitis B immunization.** Series of 3 immunizations  (if not done previously, usually given at 0, 1 to 2, and 4 to 6 months)  Check with your caregiver, if vaccination not previously given Check with your caregiver, if vaccination not previously given  ** Family history and personal history of risk and conditions may change your caregiver's recommendations.  Document Released: 06/09/2001 Document Re-Released: 07/08/2009 [facility] Patient Information 2011 [place], [place].

## 2011-01-28 NOTE — Progress Notes (Signed)
  Subjective:    Patient ID: Angela Andrews, female    DOB: 1980-09-28, 30 y.o.   MRN: [id number]  HPI 30yo G3P3 presenting today for annual exam and discussion of IUD. Patient has a history of depression and anxiety and was started on Celexa a few months ago. Patient reports that since IUD was placed last year, her modd swings and irritabilty seems to have increased and has read that these symptoms may be related to the IUD. Patient desires IUD to be removed and would like to start Depo-Provera. Patient also reports a 10lb weight gain over the past year that she cannot seem to lose. She has changed her diet and is more physically active. Patient also reports excessive fatigue which is a known side effect of the Celexa which she takes in the morning   Review of Systems  All other systems reviewed and are negative.       Objective:   Physical Exam  GENERAL: Well-developed, well-nourished female in no acute distress.  HEENT: Normocephalic, atraumatic. Sclerae anicteric.  NECK: Supple. Normal thyroid.  LUNGS: Clear to auscultation bilaterally.  HEART: Regular rate and rhythm. BREASTS: Symmetric in size. No palpable masses or lymphadenopathy, skin changes, or nipple drainage. ABDOMEN: Soft, nontender, nondistended. No organomegaly. PELVIC: Normal external female genitalia. Vagina is pink and rugated.  Normal discharge. Normal appearing cervix. Uterus is normal in size. No adnexal mass or tenderness. EXTREMITIES: No cyanosis, clubbing, or edema, 2+ distal pulses.       Assessment & Plan:  30 yo G3P3 with depression/anxiety on Celexa requesting IUD removed -pap smear obtained today - IUD removed without difficulty after informed consent was obtained - patient to start depo-provera today. Patient was made aware of the increased appetite associated with depo-provera and the potential for weight gain. Patient to use condoms for the next 2 weeks. - Patient advised to take celexa in the evening  to see if her symptoms of excessive fatigue will resolve before we try changing the medication. - Patient to continue monitoring weight gain and changing diet and exercise regimen.

## 2011-03-12 ENCOUNTER — Other Ambulatory Visit: Payer: Self-pay | Admitting: Family Medicine

## 2011-03-16 ENCOUNTER — Telehealth: Payer: Self-pay

## 2011-03-16 MED ORDER — PAROXETINE HCL 20 MG PO TABS: 20.0000 mg | ORAL_TABLET | ORAL | Status: DC

## 2011-03-16 NOTE — Telephone Encounter (Signed)
May need to increase dosing to 40 mg but seems like pt. Would like this medication changed.  Trial of Paxil--rx sent.

## 2011-03-16 NOTE — Telephone Encounter (Signed)
Patient is on Celexa 20mg , she was advised to take it in the evening due to having severe fatigue with it in the day. But she feels it is not working for her overall. She would like to know if there is something else she can try. She took her last one last night. Her pharmacy is  [facility]. Thanks :)

## 2011-04-29 ENCOUNTER — Ambulatory Visit: Payer: Private Health Insurance - Indemnity

## 2011-05-20 ENCOUNTER — Ambulatory Visit (INDEPENDENT_AMBULATORY_CARE_PROVIDER_SITE_OTHER): Payer: Private Health Insurance - Indemnity | Admitting: *Deleted

## 2011-05-20 DIAGNOSIS — Z3049 Encounter for surveillance of other contraceptives: Secondary | ICD-10-CM

## 2011-05-20 DIAGNOSIS — Z3042 Encounter for surveillance of injectable contraceptive: Secondary | ICD-10-CM

## 2011-05-20 MED ORDER — MEDROXYPROGESTERONE ACETATE 150 MG/ML IM SUSP
150.0000 mg | Freq: Once | INTRAMUSCULAR | Status: AC
  Administered 2011-05-20: 150 mg via INTRAMUSCULAR

## 2011-06-21 ENCOUNTER — Other Ambulatory Visit: Payer: Self-pay | Admitting: Family Medicine

## 2011-07-15 ENCOUNTER — Encounter: Payer: Self-pay | Admitting: Obstetrics & Gynecology

## 2011-07-15 ENCOUNTER — Ambulatory Visit (INDEPENDENT_AMBULATORY_CARE_PROVIDER_SITE_OTHER): Payer: Private Health Insurance - Indemnity | Admitting: Obstetrics & Gynecology

## 2011-07-15 VITALS — BP 128/84 | HR 107 | Ht 65.0 in | Wt 159.0 lb

## 2011-07-15 DIAGNOSIS — R109 Unspecified abdominal pain: Secondary | ICD-10-CM

## 2011-07-15 DIAGNOSIS — N921 Excessive and frequent menstruation with irregular cycle: Secondary | ICD-10-CM

## 2011-07-15 DIAGNOSIS — R1031 Right lower quadrant pain: Secondary | ICD-10-CM | POA: Insufficient documentation

## 2011-07-15 DIAGNOSIS — N923 Ovulation bleeding: Secondary | ICD-10-CM | POA: Insufficient documentation

## 2011-07-15 NOTE — Patient Instructions (Signed)
Will follow up results and manage accordingly

## 2011-07-15 NOTE — Progress Notes (Signed)
History:  31 y.o. Angela Andrews here today for report of irregular spotting and abdominal cramping since Depo Provera initiation six months ago.  Was on the Mirena IUD prior to that and had no periods.  Cramping is associated with the bleeding. Also reports dyspareunia.  Reports abnormal discharge with spotting, no pruritus.  The following portions of the patient's history were reviewed and updated as appropriate: allergies, current medications, past family history, past medical history, past social history, past surgical history and problem list.  Last pap 02/24/2011 normal   Objective:  Physical Exam Blood pressure 128/84, pulse 107, height 5\' 5"  (1.651 m), weight 159 lb (72.122 kg). Gen: NAD Abd: Soft, non tender, nondistended Pelvic: Normal appearing external genitalia; normal appearing vaginal mucosa and cervix.  Brown discharge noted.  Small uterus, no palpable masses or adnexal tenderness. No CMT.  GC/Chlam, wet prep samples obtained.  Assessment & Plan:  Follow up labs, pelvic ultrasound ordered Likely Depo Provera effect, she was given a pack of Lo-Lo Estrin to help stabilize her endometrial lining, will follow up results She will return soon to discuss results and further management

## 2011-07-16 LAB — WET PREP, GENITAL: Trich, Wet Prep: NONE SEEN

## 2011-07-21 ENCOUNTER — Ambulatory Visit (HOSPITAL_COMMUNITY): Admission: RE | Admit: 2011-07-21 | Payer: Private Health Insurance - Indemnity | Source: Ambulatory Visit

## 2011-07-29 ENCOUNTER — Ambulatory Visit: Payer: Private Health Insurance - Indemnity | Admitting: Obstetrics & Gynecology

## 2011-08-05 ENCOUNTER — Ambulatory Visit: Payer: Self-pay | Admitting: Family Medicine

## 2011-08-19 ENCOUNTER — Other Ambulatory Visit: Payer: Private Health Insurance - Indemnity

## 2011-08-20 ENCOUNTER — Other Ambulatory Visit (INDEPENDENT_AMBULATORY_CARE_PROVIDER_SITE_OTHER): Payer: Private Health Insurance - Indemnity | Admitting: *Deleted

## 2011-08-20 DIAGNOSIS — Z3049 Encounter for surveillance of other contraceptives: Secondary | ICD-10-CM

## 2011-08-20 DIAGNOSIS — Z01812 Encounter for preprocedural laboratory examination: Secondary | ICD-10-CM

## 2011-08-20 LAB — POCT URINE PREGNANCY: Preg Test, Ur: NEGATIVE

## 2011-08-20 NOTE — Progress Notes (Signed)
Addended by: Barbara Cower on: 08/20/2011 02:39 PM   Modules accepted: Orders

## 2011-09-16 ENCOUNTER — Ambulatory Visit: Payer: Self-pay | Admitting: General Surgery

## 2011-09-21 LAB — PATHOLOGY REPORT

## 2011-11-25 ENCOUNTER — Ambulatory Visit: Payer: Private Health Insurance - Indemnity

## 2013-05-15 ENCOUNTER — Ambulatory Visit (INDEPENDENT_AMBULATORY_CARE_PROVIDER_SITE_OTHER): Payer: Private Health Insurance - Indemnity | Admitting: Obstetrics & Gynecology

## 2013-05-15 ENCOUNTER — Encounter: Payer: Self-pay | Admitting: Obstetrics & Gynecology

## 2013-05-15 VITALS — BP 125/81 | HR 91 | Ht 67.0 in | Wt 148.0 lb

## 2013-05-15 DIAGNOSIS — Z01419 Encounter for gynecological examination (general) (routine) without abnormal findings: Secondary | ICD-10-CM

## 2013-05-15 DIAGNOSIS — Z1151 Encounter for screening for human papillomavirus (HPV): Secondary | ICD-10-CM

## 2013-05-15 DIAGNOSIS — Z124 Encounter for screening for malignant neoplasm of cervix: Secondary | ICD-10-CM

## 2013-05-15 NOTE — Patient Instructions (Signed)
Preventive Care for Adults, Female A healthy lifestyle and preventive care can promote health and wellness. Preventive health guidelines for women include the following key practices.  A routine yearly physical is a good way to check with your health care provider about your health and preventive screening. It is a chance to share any concerns and updates on your health and to receive a thorough exam.  Visit your dentist for a routine exam and preventive care every 6 months. Brush your teeth twice a day and floss once a day. Good oral hygiene prevents tooth decay and gum disease.  The frequency of eye exams is based on your age, health, family medical history, use of contact lenses, and other factors. Follow your health care provider's recommendations for frequency of eye exams.  Eat a healthy diet. Foods like vegetables, fruits, whole grains, low-fat dairy products, and lean protein foods contain the nutrients you need without too many calories. Decrease your intake of foods high in solid fats, added sugars, and salt. Eat the right amount of calories for you.Get information about a proper diet from your health care provider, if necessary.  Regular physical exercise is one of the most important things you can do for your health. Most adults should get at least 150 minutes of moderate-intensity exercise (any activity that increases your heart rate and causes you to sweat) each week. In addition, most adults need muscle-strengthening exercises on 2 or more days a week.  Maintain a healthy weight. The body mass index (BMI) is a screening tool to identify possible weight problems. It provides an estimate of body fat based on height and weight. Your health care provider can find your BMI, and can help you achieve or maintain a healthy weight.For adults 20 years and older:  A BMI below 18.5 is considered underweight.  A BMI of 18.5 to 24.9 is normal.  A BMI of 25 to 29.9 is considered  overweight.  A BMI of 30 and above is considered obese.  Maintain normal blood lipids and cholesterol levels by exercising and minimizing your intake of saturated fat. Eat a balanced diet with plenty of fruit and vegetables. Blood tests for lipids and cholesterol should begin at age 20 and be repeated every 5 years. If your lipid or cholesterol levels are high, you are over 50, or you are at high risk for heart disease, you may need your cholesterol levels checked more frequently.Ongoing high lipid and cholesterol levels should be treated with medicines if diet and exercise are not working.  If you smoke, find out from your health care provider how to quit. If you do not use tobacco, do not start.  Lung cancer screening is recommended for adults aged 55 80 years who are at high risk for developing lung cancer because of a history of smoking. A yearly low-dose CT scan of the lungs is recommended for people who have at least a 30-pack-year history of smoking and are a current smoker or have quit within the past 15 years. A pack year of smoking is smoking an average of 1 pack of cigarettes a day for 1 year (for example: 1 pack a day for 30 years or 2 packs a day for 15 years). Yearly screening should continue until the smoker has stopped smoking for at least 15 years. Yearly screening should be stopped for people who develop a health problem that would prevent them from having lung cancer treatment.  If you are pregnant, do not drink alcohol. If you   are breastfeeding, be very cautious about drinking alcohol. If you are not pregnant and choose to drink alcohol, do not have more than 1 drink per day. One drink is considered to be 12 ounces (355 mL) of beer, 5 ounces (148 mL) of wine, or 1.5 ounces (44 mL) of liquor.  Avoid use of street drugs. Do not share needles with anyone. Ask for help if you need support or instructions about stopping the use of drugs.  High blood pressure causes heart disease and  increases the risk of stroke. Your blood pressure should be checked at least every 1 to 2 years. Ongoing high blood pressure should be treated with medicines if weight loss and exercise do not work.  If you are 20 33 years old, ask your health care provider if you should take aspirin to prevent strokes.  Diabetes screening involves taking a blood sample to check your fasting blood sugar level. This should be done once every 3 years, after age 35, if you are within normal weight and without risk factors for diabetes. Testing should be considered at a younger age or be carried out more frequently if you are overweight and have at least 1 risk factor for diabetes.  Breast cancer screening is essential preventive care for women. You should practice "breast self-awareness." This means understanding the normal appearance and feel of your breasts and may include breast self-examination. Any changes detected, no matter how small, should be reported to a health care provider. Women in their 42s and 30s should have a clinical breast exam (CBE) by a health care provider as part of a regular health exam every 1 to 3 years. After age 74, women should have a CBE every year. Starting at age 43, women should consider having a mammogram (breast X-ray test) every year. Women who have a family history of breast cancer should talk to their health care provider about genetic screening. Women at a high risk of breast cancer should talk to their health care providers about having an MRI and a mammogram every year.  Breast cancer gene (BRCA)-related cancer risk assessment is recommended for women who have family members with BRCA-related cancers. BRCA-related cancers include breast, ovarian, tubal, and peritoneal cancers. Having family members with these cancers may be associated with an increased risk for harmful changes (mutations) in the breast cancer genes BRCA1 and BRCA2. Results of the assessment will determine the need for  genetic counseling and BRCA1 and BRCA2 testing.  The Pap test is a screening test for cervical cancer. A Pap test can show cell changes on the cervix that might become cervical cancer if left untreated. A Pap test is a procedure in which cells are obtained and examined from the lower end of the uterus (cervix).  Women should have a Pap test starting at age 60.  Between ages 63 and 62, Pap tests should be repeated every 2 years.  Beginning at age 43, you should have a Pap test every 3 years as long as the past 3 Pap tests have been normal.  Some women have medical problems that increase the chance of getting cervical cancer. Talk to your health care provider about these problems. It is especially important to talk to your health care provider if a new problem develops soon after your last Pap test. In these cases, your health care provider may recommend more frequent screening and Pap tests.  The above recommendations are the same for women who have or have not gotten the vaccine  for human papillomavirus (HPV).  If you had a hysterectomy for a problem that was not cancer or a condition that could lead to cancer, then you no longer need Pap tests. Even if you no longer need a Pap test, a regular exam is a good idea to make sure no other problems are starting.  If you are between ages 65 and 70 years, and you have had normal Pap tests going back 10 years, you no longer need Pap tests. Even if you no longer need a Pap test, a regular exam is a good idea to make sure no other problems are starting.  If you have had past treatment for cervical cancer or a condition that could lead to cancer, you need Pap tests and screening for cancer for at least 20 years after your treatment.  If Pap tests have been discontinued, risk factors (such as a new sexual partner) need to be reassessed to determine if screening should be resumed.  The HPV test is an additional test that may be used for cervical cancer  screening. The HPV test looks for the virus that can cause the cell changes on the cervix. The cells collected during the Pap test can be tested for HPV. The HPV test could be used to screen women aged 30 years and older, and should be used in women of any age who have unclear Pap test results. After the age of 30, women should have HPV testing at the same frequency as a Pap test.  Colorectal cancer can be detected and often prevented. Most routine colorectal cancer screening begins at the age of 50 years and continues through age 75 years. However, your health care provider may recommend screening at an earlier age if you have risk factors for colon cancer. On a yearly basis, your health care provider may provide home test kits to check for hidden blood in the stool. Use of a small camera at the end of a tube, to directly examine the colon (sigmoidoscopy or colonoscopy), can detect the earliest forms of colorectal cancer. Talk to your health care provider about this at age 50, when routine screening begins. Direct exam of the colon should be repeated every 5 10 years through age 75 years, unless early forms of pre-cancerous polyps or small growths are found.  People who are at an increased risk for hepatitis B should be screened for this virus. You are considered at high risk for hepatitis B if:  You were born in a country where hepatitis B occurs often. Talk with your health care provider about which countries are considered high risk.  Your parents were born in a high-risk country and you have not received a shot to protect against hepatitis B (hepatitis B vaccine).  You have HIV or AIDS.  You use needles to inject street drugs.  You live with, or have sex with, someone who has Hepatitis B.  You get hemodialysis treatment.  You take certain medicines for conditions like cancer, organ transplantation, and autoimmune conditions.  Hepatitis C blood testing is recommended for all people born from  1945 through 1965 and any individual with known risks for hepatitis C.  Practice safe sex. Use condoms and avoid high-risk sexual practices to reduce the spread of sexually transmitted infections (STIs). STIs include gonorrhea, chlamydia, syphilis, trichomonas, herpes, HPV, and human immunodeficiency virus (HIV). Herpes, HIV, and HPV are viral illnesses that have no cure. They can result in disability, cancer, and death. Sexually active women aged 25   years and younger should be checked for chlamydia. Older women with new or multiple partners should also be tested for chlamydia. Testing for other STIs is recommended if you are sexually active and at increased risk.  Osteoporosis is a disease in which the bones lose minerals and strength with aging. This can result in serious bone fractures or breaks. The risk of osteoporosis can be identified using a bone density scan. Women ages 65 years and over and women at risk for fractures or osteoporosis should discuss screening with their health care providers. Ask your health care provider whether you should take a calcium supplement or vitamin D to reduce the rate of osteoporosis.  Menopause can be associated with physical symptoms and risks. Hormone replacement therapy is available to decrease symptoms and risks. You should talk to your health care provider about whether hormone replacement therapy is right for you.  Use sunscreen. Apply sunscreen liberally and repeatedly throughout the day. You should seek shade when your shadow is shorter than you. Protect yourself by wearing long sleeves, pants, a wide-brimmed hat, and sunglasses year round, whenever you are outdoors.  Once a month, do a whole body skin exam, using a mirror to look at the skin on your back. Tell your health care provider of new moles, moles that have irregular borders, moles that are larger than a pencil eraser, or moles that have changed in shape or color.  Stay current with required  vaccines (immunizations).  Influenza vaccine. All adults should be immunized every year.  Tetanus, diphtheria, and acellular pertussis (Td, Tdap) vaccine. Pregnant women should receive 1 dose of Tdap vaccine during each pregnancy. The dose should be obtained regardless of the length of time since the last dose. Immunization is preferred during the 27th 36th week of gestation. An adult who has not previously received Tdap or who does not know her vaccine status should receive 1 dose of Tdap. This initial dose should be followed by tetanus and diphtheria toxoids (Td) booster doses every 10 years. Adults with an unknown or incomplete history of completing a 3-dose immunization series with Td-containing vaccines should begin or complete a primary immunization series including a Tdap dose. Adults should receive a Td booster every 10 years.  Varicella vaccine. An adult without evidence of immunity to varicella should receive 2 doses or a second dose if she has previously received 1 dose. Pregnant females who do not have evidence of immunity should receive the first dose after pregnancy. This first dose should be obtained before leaving the health care facility. The second dose should be obtained 4 8 weeks after the first dose.  Human papillomavirus (HPV) vaccine. Females aged 13 26 years who have not received the vaccine previously should obtain the 3-dose series. The vaccine is not recommended for use in pregnant females. However, pregnancy testing is not needed before receiving a dose. If a female is found to be pregnant after receiving a dose, no treatment is needed. In that case, the remaining doses should be delayed until after the pregnancy. Immunization is recommended for any person with an immunocompromised condition through the age of 26 years if she did not get any or all doses earlier. During the 3-dose series, the second dose should be obtained 4 8 weeks after the first dose. The third dose should be  obtained 24 weeks after the first dose and 16 weeks after the second dose.  Zoster vaccine. One dose is recommended for adults aged 60 years or older unless certain   conditions are present.  Measles, mumps, and rubella (MMR) vaccine. Adults born before 1957 generally are considered immune to measles and mumps. Adults born in 1957 or later should have 1 or more doses of MMR vaccine unless there is a contraindication to the vaccine or there is laboratory evidence of immunity to each of the three diseases. A routine second dose of MMR vaccine should be obtained at least 28 days after the first dose for students attending postsecondary schools, health care workers, or international travelers. People who received inactivated measles vaccine or an unknown type of measles vaccine during 1963 1967 should receive 2 doses of MMR vaccine. People who received inactivated mumps vaccine or an unknown type of mumps vaccine before 1979 and are at high risk for mumps infection should consider immunization with 2 doses of MMR vaccine. For females of childbearing age, rubella immunity should be determined. If there is no evidence of immunity, females who are not pregnant should be vaccinated. If there is no evidence of immunity, females who are pregnant should delay immunization until after pregnancy. Unvaccinated health care workers born before 1957 who lack laboratory evidence of measles, mumps, or rubella immunity or laboratory confirmation of disease should consider measles and mumps immunization with 2 doses of MMR vaccine or rubella immunization with 1 dose of MMR vaccine.  Pneumococcal 13-valent conjugate (PCV13) vaccine. When indicated, a person who is uncertain of her immunization history and has no record of immunization should receive the PCV13 vaccine. An adult aged 19 years or older who has certain medical conditions and has not been previously immunized should receive 1 dose of PCV13 vaccine. This PCV13 should be  followed with a dose of pneumococcal polysaccharide (PPSV23) vaccine. The PPSV23 vaccine dose should be obtained at least 8 weeks after the dose of PCV13 vaccine. An adult aged 19 years or older who has certain medical conditions and previously received 1 or more doses of PPSV23 vaccine should receive 1 dose of PCV13. The PCV13 vaccine dose should be obtained 1 or more years after the last PPSV23 vaccine dose.  Pneumococcal polysaccharide (PPSV23) vaccine. When PCV13 is also indicated, PCV13 should be obtained first. All adults aged 65 years and older should be immunized. An adult younger than age 65 years who has certain medical conditions should be immunized. Any person who resides in a nursing home or long-term care facility should be immunized. An adult smoker should be immunized. People with an immunocompromised condition and certain other conditions should receive both PCV13 and PPSV23 vaccines. People with human immunodeficiency virus (HIV) infection should be immunized as soon as possible after diagnosis. Immunization during chemotherapy or radiation therapy should be avoided. Routine use of PPSV23 vaccine is not recommended for American Indians, [place] Natives, or people younger than 65 years unless there are medical conditions that require PPSV23 vaccine. When indicated, people who have unknown immunization and have no record of immunization should receive PPSV23 vaccine. One-time revaccination 5 years after the first dose of PPSV23 is recommended for people aged 19 64 years who have chronic kidney failure, nephrotic syndrome, asplenia, or immunocompromised conditions. People who received 1 2 doses of PPSV23 before age 65 years should receive another dose of PPSV23 vaccine at age 65 years or later if at least 5 years have passed since the previous dose. Doses of PPSV23 are not needed for people immunized with PPSV23 at or after age 65 years.  Meningococcal vaccine. Adults with asplenia or persistent  complement component deficiencies should receive 2   doses of quadrivalent meningococcal conjugate (MenACWY-D) vaccine. The doses should be obtained at least 2 months apart. Microbiologists working with certain meningococcal bacteria, military recruits, people at risk during an outbreak, and people who travel to or live in countries with a high rate of meningitis should be immunized. A first-year college student up through age 21 years who is living in a residence hall should receive a dose if she did not receive a dose on or after her 16th birthday. Adults who have certain high-risk conditions should receive one or more doses of vaccine.  Hepatitis A vaccine. Adults who wish to be protected from this disease, have certain high-risk conditions, work with hepatitis A-infected animals, work in hepatitis A research labs, or travel to or work in countries with a high rate of hepatitis A should be immunized. Adults who were previously unvaccinated and who anticipate close contact with an international adoptee during the first 60 days after arrival in the [place] from a country with a high rate of hepatitis A should be immunized.  Hepatitis B vaccine. Adults who wish to be protected from this disease, have certain high-risk conditions, may be exposed to blood or other infectious body fluids, are household contacts or sex partners of hepatitis B positive people, are clients or workers in certain care facilities, or travel to or work in countries with a high rate of hepatitis B should be immunized.  Haemophilus influenzae type b (Hib) vaccine. A previously unvaccinated person with asplenia or sickle cell disease or having a scheduled splenectomy should receive 1 dose of Hib vaccine. Regardless of previous immunization, a recipient of a hematopoietic stem cell transplant should receive a 3-dose series 6 12 months after her successful transplant. Hib vaccine is not recommended for adults with HIV  infection. Preventive Services / Frequency Ages 19 to 39years  Blood pressure check.** / Every 1 to 2 years.  Lipid and cholesterol check.** / Every 5 years beginning at age 20.  Clinical breast exam.** / Every 3 years for women in their 20s and 30s.  BRCA-related cancer risk assessment.** / For women who have family members with a BRCA-related cancer (breast, ovarian, tubal, or peritoneal cancers).  Pap test.** / Every 2 years from ages 21 through 29. Every 3 years starting at age 30 through age 65 or 70 with a history of 3 consecutive normal Pap tests.  HPV screening.** / Every 3 years from ages 30 through ages 65 to 70 with a history of 3 consecutive normal Pap tests.  Hepatitis C blood test.** / For any individual with known risks for hepatitis C.  Skin self-exam. / Monthly.  Influenza vaccine. / Every year.  Tetanus, diphtheria, and acellular pertussis (Tdap, Td) vaccine.** / Consult your health care provider. Pregnant women should receive 1 dose of Tdap vaccine during each pregnancy. 1 dose of Td every 10 years.  Varicella vaccine.** / Consult your health care provider. Pregnant females who do not have evidence of immunity should receive the first dose after pregnancy.  HPV vaccine. / 3 doses over 6 months, if 26 and younger. The vaccine is not recommended for use in pregnant females. However, pregnancy testing is not needed before receiving a dose.  Measles, mumps, rubella (MMR) vaccine.** / You need at least 1 dose of MMR if you were born in 1957 or later. You may also need a 2nd dose. For females of childbearing age, rubella immunity should be determined. If there is no evidence of immunity, females who are not   pregnant should be vaccinated. If there is no evidence of immunity, females who are pregnant should delay immunization until after pregnancy.  Pneumococcal 13-valent conjugate (PCV13) vaccine.** / Consult your health care provider.  Pneumococcal polysaccharide (PPSV23)  vaccine.** / 1 to 2 doses if you smoke cigarettes or if you have certain conditions.  Meningococcal vaccine.** / 1 dose if you are age 88 to 41 years and a Market researcher living in a residence hall, or have one of several medical conditions, you need to get vaccinated against meningococcal disease. You may also need additional booster doses.  Hepatitis A vaccine.** / Consult your health care provider.  Hepatitis B vaccine.** / Consult your health care provider.  Haemophilus influenzae type b (Hib) vaccine.** / Consult your health care provider. Ages 45 to 64years  Blood pressure check.** / Every 1 to 2 years.  Lipid and cholesterol check.** / Every 5 years beginning at age 2 years.  Lung cancer screening. / Every year if you are aged 10 80 years and have a 30-pack-year history of smoking and currently smoke or have quit within the past 15 years. Yearly screening is stopped once you have quit smoking for at least 15 years or develop a health problem that would prevent you from having lung cancer treatment.  Clinical breast exam.** / Every year after age 28 years.  BRCA-related cancer risk assessment.** / For women who have family members with a BRCA-related cancer (breast, ovarian, tubal, or peritoneal cancers).  Mammogram.** / Every year beginning at age 63 years and continuing for as long as you are in good health. Consult with your health care provider.  Pap test.** / Every 3 years starting at age 71 years through age 39 or 90 years with a history of 3 consecutive normal Pap tests.  HPV screening.** / Every 3 years from ages 27 years through ages 32 to 72 years with a history of 3 consecutive normal Pap tests.  Fecal occult blood test (FOBT) of stool. / Every year beginning at age 40 years and continuing until age 29 years. You may not need to do this test if you get a colonoscopy every 10 years.  Flexible sigmoidoscopy or colonoscopy.** / Every 5 years for a flexible  sigmoidoscopy or every 10 years for a colonoscopy beginning at age 66 years and continuing until age 47 years.  Hepatitis C blood test.** / For all people born from [birth year] through 1965 and any individual with known risks for hepatitis C.  Skin self-exam. / Monthly.  Influenza vaccine. / Every year.  Tetanus, diphtheria, and acellular pertussis (Tdap/Td) vaccine.** / Consult your health care provider. Pregnant women should receive 1 dose of Tdap vaccine during each pregnancy. 1 dose of Td every 10 years.  Varicella vaccine.** / Consult your health care provider. Pregnant females who do not have evidence of immunity should receive the first dose after pregnancy.  Zoster vaccine.** / 1 dose for adults aged 39 years or older.  Measles, mumps, rubella (MMR) vaccine.** / You need at least 1 dose of MMR if you were born in 1957 or later. You may also need a 2nd dose. For females of childbearing age, rubella immunity should be determined. If there is no evidence of immunity, females who are not pregnant should be vaccinated. If there is no evidence of immunity, females who are pregnant should delay immunization until after pregnancy.  Pneumococcal 13-valent conjugate (PCV13) vaccine.** / Consult your health care provider.  Pneumococcal polysaccharide (PPSV23) vaccine.** / 1  to 2 doses if you smoke cigarettes or if you have certain conditions.  Meningococcal vaccine.** / Consult your health care provider.  Hepatitis A vaccine.** / Consult your health care provider.  Hepatitis B vaccine.** / Consult your health care provider.  Haemophilus influenzae type b (Hib) vaccine.** / Consult your health care provider. Ages 66 years and over  Blood pressure check.** / Every 1 to 2 years.  Lipid and cholesterol check.** / Every 5 years beginning at age 50 years.  Lung cancer screening. / Every year if you are aged 65 80 years and have a 30-pack-year history of smoking and currently smoke or have quit  within the past 15 years. Yearly screening is stopped once you have quit smoking for at least 15 years or develop a health problem that would prevent you from having lung cancer treatment.  Clinical breast exam.** / Every year after age 71 years.  BRCA-related cancer risk assessment.** / For women who have family members with a BRCA-related cancer (breast, ovarian, tubal, or peritoneal cancers).  Mammogram.** / Every year beginning at age 58 years and continuing for as long as you are in good health. Consult with your health care provider.  Pap test.** / Every 3 years starting at age 51 years through age 34 or 38 years with 3 consecutive normal Pap tests. Testing can be stopped between 65 and 70 years with 3 consecutive normal Pap tests and no abnormal Pap or HPV tests in the past 10 years.  HPV screening.** / Every 3 years from ages 35 years through ages 84 or 80 years with a history of 3 consecutive normal Pap tests. Testing can be stopped between 65 and 70 years with 3 consecutive normal Pap tests and no abnormal Pap or HPV tests in the past 10 years.  Fecal occult blood test (FOBT) of stool. / Every year beginning at age 26 years and continuing until age 51 years. You may not need to do this test if you get a colonoscopy every 10 years.  Flexible sigmoidoscopy or colonoscopy.** / Every 5 years for a flexible sigmoidoscopy or every 10 years for a colonoscopy beginning at age 55 years and continuing until age 19 years.  Hepatitis C blood test.** / For all people born from [birth year] through 1965 and any individual with known risks for hepatitis C.  Osteoporosis screening.** / A one-time screening for women ages 68 years and over and women at risk for fractures or osteoporosis.  Skin self-exam. / Monthly.  Influenza vaccine. / Every year.  Tetanus, diphtheria, and acellular pertussis (Tdap/Td) vaccine.** / 1 dose of Td every 10 years.  Varicella vaccine.** / Consult your health care  provider.  Zoster vaccine.** / 1 dose for adults aged 21 years or older.  Pneumococcal 13-valent conjugate (PCV13) vaccine.** / Consult your health care provider.  Pneumococcal polysaccharide (PPSV23) vaccine.** / 1 dose for all adults aged 43 years and older.  Meningococcal vaccine.** / Consult your health care provider.  Hepatitis A vaccine.** / Consult your health care provider.  Hepatitis B vaccine.** / Consult your health care provider.  Haemophilus influenzae type b (Hib) vaccine.** / Consult your health care provider. ** Family history and personal history of risk and conditions may change your health care provider's recommendations. Document Released: 06/09/2001 Document Revised: 02/01/2013 Document Reviewed: 09/08/2010 [facility] Patient Information 2014 [place], [place].  Thank you for enrolling in [facility]. Please follow the instructions below to securely access your online medical record. MyChart allows you to send messages  to your doctor, view your test results, manage appointments, and more.   How Do I Sign Up? 1. In your Internet browser, go to [employer] and enter [email or web address]. 2. Click on the Sign Up Now link in the Sign In box. You will see the New Member Sign Up page. 3. Enter your MyChart Access Code exactly as it appears below. You will not need to use this code after you've completed the sign-up process. If you do not sign up before the expiration date, you must request a new code.  MyChart Access Code: [email or web address] Expires: 07/14/2013  4:23 PM  4. Enter your Social Security Number ([id number]) and Date of Birth (mm/dd/yyyy) as indicated and click Submit. You will be taken to the next sign-up page. 5. Create a MyChart ID. This will be your MyChart login ID and cannot be changed, so think of one that is secure and easy to remember. 6. Create a MyChart password. You can change your password at any time. 7. Enter your Password Reset Question  and Answer. This can be used at a later time if you forget your password.  8. Enter your e-mail address. You will receive e-mail notification when new information is available in [facility]. 9. Click Sign Up. You can now view your medical record.   Additional Information Remember, MyChart is NOT to be used for urgent needs. For medical emergencies, dial 911.

## 2013-05-15 NOTE — Progress Notes (Signed)
  Subjective:     [name] is a 33 y.o. G3P3 female and is here for a annual gynecologic exam. The patient reports no problems.  History   Social History  . Marital Status: Married    Spouse Name: N/A    Number of Children: N/A  . Years of Education: N/A   Occupational History  . Not on file.   Social History Main Topics  . Smoking status: Never Smoker   . Smokeless tobacco: Never Used  . Alcohol Use: Yes     Comment: rare  . Drug Use: No  . Sexual Activity: Yes    Partners: Male   Other Topics Concern  . Not on file   Social History Narrative  . No narrative on file   Health Maintenance  Topic Date Due  . Tetanus/tdap  04/09/2000  . Influenza Vaccine  11/25/2012  . Pap Smear  01/27/2014    The following portions of the patient's history were reviewed and updated as appropriate: allergies, current medications, past family history, past medical history, past social history, past surgical history and problem list.  Review of Systems Pertinent items are noted in HPI.   Objective:   BP 125/81  Pulse 91  Ht 5\' 7"  (1.702 m)  Wt 148 lb (67.132 kg)  BMI 23.17 kg/m2  LMP 05/07/2013 GENERAL: Well-developed, well-nourished female in no acute distress.  HEENT: Normocephalic, atraumatic. Sclerae anicteric.  NECK: Supple. Normal thyroid.  LUNGS: Clear to auscultation bilaterally.  HEART: Regular rate and rhythm. BREASTS: Symmetric in size. No masses, skin changes, nipple drainage, or lymphadenopathy. ABDOMEN: Soft, nontender, nondistended. No organomegaly. PELVIC: Normal external female genitalia. Vagina is pink and rugated.  Normal discharge. Normal cervix contour. Pap smear obtained. Uterus is normal in size. No adnexal mass or tenderness.  EXTREMITIES: No cyanosis, clubbing, or edema, 2+ distal pulses.   Assessment:    Healthy female exam.   Plan:   Pap done, will follow up results and manage accordingly. Routine preventative health maintenance measures  emphasized   Jaynie CollinsUGONNA  Nayleen Janosik, MD, FACOG Attending Obstetrician & Gynecologist Faculty Practice, [facility]

## 2013-05-17 ENCOUNTER — Other Ambulatory Visit (INDEPENDENT_AMBULATORY_CARE_PROVIDER_SITE_OTHER): Payer: Private Health Insurance - Indemnity | Admitting: *Deleted

## 2013-05-17 DIAGNOSIS — Z1329 Encounter for screening for other suspected endocrine disorder: Secondary | ICD-10-CM

## 2013-05-17 DIAGNOSIS — Z01419 Encounter for gynecological examination (general) (routine) without abnormal findings: Secondary | ICD-10-CM

## 2013-05-17 DIAGNOSIS — Z3480 Encounter for supervision of other normal pregnancy, unspecified trimester: Secondary | ICD-10-CM

## 2013-05-17 DIAGNOSIS — Z13228 Encounter for screening for other metabolic disorders: Secondary | ICD-10-CM

## 2013-05-17 DIAGNOSIS — Z130 Encounter for screening for diseases of the blood and blood-forming organs and certain disorders involving the immune mechanism: Secondary | ICD-10-CM

## 2013-05-17 DIAGNOSIS — Z13220 Encounter for screening for lipoid disorders: Secondary | ICD-10-CM

## 2013-05-17 LAB — CBC
HEMATOCRIT: 40 % (ref 36.0–46.0)
HEMOGLOBIN: 12.9 g/dL (ref 12.0–15.0)
MCH: 26.2 pg (ref 26.0–34.0)
MCHC: 32.3 g/dL (ref 30.0–36.0)
MCV: 81.1 fL (ref 78.0–100.0)
Platelets: 313 10*3/uL (ref 150–400)
RBC: 4.93 MIL/uL (ref 3.87–5.11)
RDW: 14.5 % (ref 11.5–15.5)
WBC: 5.9 10*3/uL (ref 4.0–10.5)

## 2013-05-18 LAB — LIPID PANEL
CHOLESTEROL: 140 mg/dL (ref 0–200)
HDL: 62 mg/dL (ref >39)
LDL Cholesterol: 61 mg/dL (ref 0–99)
TRIGLYCERIDES: 83 mg/dL (ref <150)
Total CHOL/HDL Ratio: 2.3 Ratio
VLDL: 17 mg/dL (ref 0–40)

## 2013-05-18 LAB — COMPREHENSIVE METABOLIC PANEL
ALBUMIN: 4 g/dL (ref 3.5–5.2)
ALK PHOS: 53 U/L (ref 39–117)
ALT: 11 U/L (ref 0–35)
AST: 15 U/L (ref 0–37)
BILIRUBIN TOTAL: 0.4 mg/dL (ref 0.3–1.2)
BUN: 10 mg/dL (ref 6–23)
CO2: 26 meq/L (ref 19–32)
Calcium: 9.2 mg/dL (ref 8.4–10.5)
Chloride: 106 mEq/L (ref 96–112)
Creat: 0.72 mg/dL (ref 0.50–1.10)
GLUCOSE: 83 mg/dL (ref 70–99)
POTASSIUM: 4.7 meq/L (ref 3.5–5.3)
Sodium: 139 mEq/L (ref 135–145)
Total Protein: 6.7 g/dL (ref 6.0–8.3)

## 2013-05-18 LAB — TSH: TSH: 1.744 u[IU]/mL (ref 0.350–4.500)

## 2014-02-26 ENCOUNTER — Encounter: Payer: Self-pay | Admitting: Obstetrics & Gynecology

## 2014-08-19 NOTE — Op Note (Signed)
PATIENT NAME:  Lars MassonONDER, Janis MR#:  [id number] DATE OF BIRTH:  Feb 14, 1981  DATE OF PROCEDURE:  09/16/2011  PREOPERATIVE DIAGNOSES: Cholelithiasis, right lower quadrant pain.   POSTOPERATIVE DIAGNOSES: Cholelithiasis, right lower quadrant pain.   OPERATIVE PROCEDURES: Diagnostic laparoscopy, excision of anterior uterine cyst, appendectomy, laparoscopic cholecystectomy with intraoperative cholangiograms.   SURGEON: Donnalee CurryJeffrey Byrnett, MD   ANESTHESIA: General endotracheal.   ESTIMATED BLOOD LOSS: Less than 5 mL.   CLINICAL NOTE: This 34 year old woman has had intermittent right lower quadrant pain. During evaluation she underwent an abdominal ultrasound showing evidence of multiple gallstones without evidence of acute cholecystitis. She was felt to be a candidate for diagnostic laparoscopy and cholecystectomy.   DESCRIPTION OF PROCEDURE: With the patient under adequate general endotracheal anesthesia, the abdomen was prepped with ChloraPrep and draped. A Veress needle was placed through a transumbilical incision and after assuring intra-abdominal location with the hanging drop test the abdomen was insufflated with CO2 at 10 mmHg pressure. A 10 mm step port was expanded and inspection showed no evidence of injury from initial port placement. The patient was placed into the reverse Trendelenburg position and rolled to the left. An 11 mm Xcel port was placed in the epigastrium and two 5 mm step ports placed in the right lateral abdominal wall. The gallbladder showed minimal inflammatory changes. It was placed on cephalad traction. The neck of the gallbladder was cleared. Fluoroscopic cholangiograms were completed with 30 mL of Conray 60. The patient had reported allergy to contrast dye and she did receive Benadryl 25 mg IV at the beginning of the procedure to minimize contrast reaction. There was no dilatation of the biliary tree. The right and left hepatic ducts were identified as well as the common bile  duct. This tapered normally but scant flow was noted in the duodenum. This was attributed to spasm. No filling defects were noted. The cystic duct and branches of the cystic artery were doubly clipped and divided. The gallbladder was then removed from the liver bed making use of hook cautery dissection. It was delivered through the umbilical port site. Inspection from the epigastric site again showed no evidence of injury.   The right lower quadrant was examined and the appendix visualized.  While the first 4 to 5 adjacent to the cecum were of a normal caliber the distal portion was approximately double this width. There were no inflammatory changes. It was elected to complete an appendectomy. The appendix was divided from the cecum with application of the blue Endo GIA cartridge. The mesentery was divided with a single application of the white vascular cartridge. The appendix was placed into an Endo Catch bag and then delivered through the umbilical site. Of note, the 10 mm step port was exchanged for a 12 mm Xcel port to allow passage of the stapler. No bleeding was noted. No spill of contents. The right and left ovaries appeared normal. There was a small cyst on the right ovary. On the anterior surface of the uterus, on the right side, there was a small pale green cystic structure. This was removed with cautery dissection and appeared to represent a cyst. This was sent for routine histology.   The right upper quadrant was inspected and good hemostasis was identified. The area was irrigated with lactated Ringer's solution. The abdomen was then desufflated and ports removed under direct vision. The fascia at the umbilicus was approximated with a 2-0 Vicryl figure-of-eight suture. Skin incisions were closed with 4-0 Vicryl subcuticular suture. Benzoin, Steri-Strips, Telfa,  and Tegaderm dressing was then applied.   The patient tolerated the procedure well and was taken to the recovery room in stable condition.   ____________________________ Earline Mayotte, MD [id number] D: 09/16/2011 21:06:44 ET T: 09/17/2011 12:17:22 ET JOB#: [id number]  cc: Earline Mayotte, MD, <Dictator> Dennison Mascot, MD JEFFREY Brion Aliment MD ELECTRONICALLY SIGNED 09/18/2011 12:28

## 2016-09-17 ENCOUNTER — Ambulatory Visit (INDEPENDENT_AMBULATORY_CARE_PROVIDER_SITE_OTHER): Payer: Self-pay | Admitting: Obstetrics and Gynecology

## 2016-09-17 VITALS — BP 116/70 | HR 91 | Ht 66.0 in | Wt 150.0 lb

## 2016-09-17 DIAGNOSIS — Z113 Encounter for screening for infections with a predominantly sexual mode of transmission: Secondary | ICD-10-CM

## 2016-09-17 DIAGNOSIS — Z3481 Encounter for supervision of other normal pregnancy, first trimester: Secondary | ICD-10-CM

## 2016-09-17 NOTE — Patient Instructions (Signed)
Morning Sickness Morning sickness is when you feel sick to your stomach (nauseous) during pregnancy. You may feel sick to your stomach and throw up (vomit). You may feel sick in the morning, but you can feel this way any time of day. Some women feel very sick to their stomach and cannot stop throwing up (hyperemesis gravidarum). Follow these instructions at home:  Only take medicines as told by your doctor.  Take multivitamins as told by your doctor. Taking multivitamins before getting pregnant can stop or lessen the harshness of morning sickness.  Eat dry toast or unsalted crackers before getting out of bed.  Eat 5 to 6 small meals a day.  Eat dry and bland foods like rice and baked potatoes.  Do not drink liquids with meals. Drink between meals.  Do not eat greasy, fatty, or spicy foods.  Have someone cook for you if the smell of food causes you to feel sick or throw up.  If you feel sick to your stomach after taking prenatal vitamins, take them at night or with a snack.  Eat protein when you need a snack (nuts, yogurt, cheese).  Eat unsweetened gelatins for dessert.  Wear a bracelet used for sea sickness (acupressure wristband).  Go to a doctor that puts thin needles into certain body points (acupuncture) to improve how you feel.  Do not smoke.  Use a humidifier to keep the air in your house free of odors.  Get lots of fresh air. Contact a doctor if:  You need medicine to feel better.  You feel dizzy or lightheaded.  You are losing weight. Get help right away if:  You feel very sick to your stomach and cannot stop throwing up.  You pass out (faint). This information is not intended to replace advice given to you by your health care provider. Make sure you discuss any questions you have with your health care provider. Document Released: 05/21/2004 Document Revised: 09/19/2015 Document Reviewed: 09/28/2012 Elsevier Interactive Patient Education  2017 [employer]. How a Baby Grows During Pregnancy Pregnancy begins when a female's sperm enters a female's egg (fertilization). This happens in one of the tubes (fallopian tubes) that connect the ovaries to the womb (uterus). The fertilized egg is called an embryo until it reaches 10 weeks. From 10 weeks until birth, it is called a fetus. The fertilized egg moves down the fallopian tube to the uterus. Then it implants into the lining of the uterus and begins to grow. The developing fetus receives oxygen and nutrients through the pregnant woman's bloodstream and the tissues that grow (placenta) to support the fetus. The placenta is the life support system for the fetus. It provides nutrition and removes waste. Learning as much as you can about your pregnancy and how your baby is developing can help you enjoy the experience. It can also make you aware of when there might be a problem and when to ask questions. How long does a typical pregnancy last? A pregnancy usually lasts 280 days, or about 40 weeks. Pregnancy is divided into three trimesters:  First trimester: 0-13 weeks.  Second trimester: 14-27 weeks.  Third trimester: 28-40 weeks. The day when your baby is considered ready to be born (full term) is your estimated date of delivery. How does my baby develop month by month? First month  The fertilized egg attaches to the inside of the uterus.  Some cells will form the placenta. Others will form the fetus.  The arms, legs, brain, spinal cord,  lungs, and heart begin to develop.  At the end of the first month, the heart begins to beat. Second month  The bones, inner ear, eyelids, hands, and feet form.  The genitals develop.  By the end of 8 weeks, all major organs are developing. Third month  All of the internal organs are forming.  Teeth develop below the gums.  Bones and muscles begin to grow. The spine can flex.  The skin is transparent.  Fingernails and toenails begin to form.  Arms  and legs continue to grow longer, and hands and feet develop.  The fetus is about 3 in (7.6 cm) long. Fourth month  The placenta is completely formed.  The external sex organs, neck, outer ear, eyebrows, eyelids, and fingernails are formed.  The fetus can hear, swallow, and move its arms and legs.  The kidneys begin to produce urine.  The skin is covered with a white waxy coating (vernix) and very fine hair (lanugo). Fifth month  The fetus moves around more and can be felt for the first time (quickening).  The fetus starts to sleep and wake up and may begin to suck its finger.  The nails grow to the end of the fingers.  The organ in the digestive system that makes bile (gallbladder) functions and helps to digest the nutrients.  If your baby is a girl, eggs are present in her ovaries. If your baby is a boy, testicles start to move down into his scrotum. Sixth month  The lungs are formed, but the fetus is not yet able to breathe.  The eyes open. The brain continues to develop.  Your baby has fingerprints and toe prints. Your baby's hair grows thicker.  At the end of the second trimester, the fetus is about 9 in (22.9 cm) long. Seventh month  The fetus kicks and stretches.  The eyes are developed enough to sense changes in light.  The hands can make a grasping motion.  The fetus responds to sound. Eighth month  All organs and body systems are fully developed and functioning.  Bones harden and taste buds develop. The fetus may hiccup.  Certain areas of the brain are still developing. The skull remains soft. Ninth month  The fetus gains about  lb (0.23 kg) each week.  The lungs are fully developed.  Patterns of sleep develop.  The fetus's head typically moves into a head-down position (vertex) in the uterus to prepare for birth. If the buttocks move into a vertex position instead, the baby is breech.  The fetus weighs 6-9 lbs (2.72-4.08 kg) and is 19-20 in  (48.26-50.8 cm) long. What can I do to have a healthy pregnancy and help my baby develop?  Eating and Drinking  Eat a healthy diet.  Talk with your health care provider to make sure that you are getting the nutrients that you and your baby need.  Visit www.BuildDNA.es to learn about creating a healthy diet.  Gain a healthy amount of weight during pregnancy as advised by your health care provider. This is usually 25-35 pounds. You may need to:  Gain more if you were underweight before getting pregnant or if you are pregnant with more than one baby.  Gain less if you were overweight or obese when you got pregnant. Medicines and Vitamins  Take prenatal vitamins as directed by your health care provider. These include vitamins such as folic acid, iron, calcium, and vitamin D. They are important for healthy development.  Take medicines only  as directed by your health care provider. Read labels and ask a pharmacist or your health care provider whether over-the-counter medicines, supplements, and prescription drugs are safe to take during pregnancy. Activities  Be physically active as advised by your health care provider. Ask your health care provider to recommend activities that are safe for you to do, such as walking or swimming.  Do not participate in strenuous or extreme sports. Lifestyle  Do not drink alcohol.  Do not use any tobacco products, including cigarettes, chewing tobacco, or electronic cigarettes. If you need help quitting, ask your health care provider.  Do not use illegal drugs. Safety  Avoid exposure to mercury, lead, or other heavy metals. Ask your health care provider about common sources of these heavy metals.  Avoid listeria infection during pregnancy. Follow these precautions:  Do not eat soft cheeses or deli meats.  Do not eat hot dogs unless they have been warmed up to the point of steaming, such as in the microwave oven.  Do not drink unpasteurized  milk.  Avoid toxoplasmosis infection during pregnancy. Follow these precautions:  Do not change your cat's litter box, if you have a cat. Ask someone else to do this for you.  Wear gardening gloves while working in the yard. General Instructions  Keep all follow-up visits as directed by your health care provider. This is important. This includes prenatal care and screening tests.  Manage any chronic health conditions. Work closely with your health care provider to keep conditions, such as diabetes, under control. How do I know if my baby is developing well? At each prenatal visit, your health care provider will do several different tests to check on your health and keep track of your baby's development. These include:  Fundal height.  Your health care provider will measure your growing belly from top to bottom using a tape measure.  Your health care provider will also feel your belly to determine your baby's position.  Heartbeat.  An ultrasound in the first trimester can confirm pregnancy and show a heartbeat, depending on how far along you are.  Your health care provider will check your baby's heart rate at every prenatal visit.  As you get closer to your delivery date, you may have regular fetal heart rate monitoring to make sure that your baby is not in distress.  Second trimester ultrasound.  This ultrasound checks your baby's development. It also indicates your baby's gender. What should I do if I have concerns about my baby's development? Always talk with your health care provider about any concerns that you may have. This information is not intended to replace advice given to you by your health care provider. Make sure you discuss any questions you have with your health care provider. Document Released: 09/30/2007 Document Revised: 09/19/2015 Document Reviewed: 09/20/2013 Elsevier Interactive Patient Education  2017 [place] of Pregnancy The first  trimester of pregnancy is from week 1 until the end of week 13 (months 1 through 3). A week after a sperm fertilizes an egg, the egg will implant on the wall of the uterus. This embryo will begin to develop into a baby. Genes from you and your partner will form the baby. The female genes will determine whether the baby will be a boy or a girl. At 6-8 weeks, the eyes and face will be formed, and the heartbeat can be seen on ultrasound. At the end of 12 weeks, all the baby's organs will be formed. Now that you  are pregnant, you will want to do everything you can to have a healthy baby. Two of the most important things are to get good prenatal care and to follow your health care provider's instructions. Prenatal care is all the medical care you receive before the baby's birth. This care will help prevent, find, and treat any problems during the pregnancy and childbirth. Body changes during your first trimester Your body goes through many changes during pregnancy. The changes vary from woman to woman.  You may gain or lose a couple of pounds at first.  You may feel sick to your stomach (nauseous) and you may throw up (vomit). If the vomiting is uncontrollable, call your health care provider.  You may tire easily.  You may develop headaches that can be relieved by medicines. All medicines should be approved by your health care provider.  You may urinate more often. Painful urination may mean you have a bladder infection.  You may develop heartburn as a result of your pregnancy.  You may develop constipation because certain hormones are causing the muscles that push stool through your intestines to slow down.  You may develop hemorrhoids or swollen veins (varicose veins).  Your breasts may begin to grow larger and become tender. Your nipples may stick out more, and the tissue that surrounds them (areola) may become darker.  Your gums may bleed and may be sensitive to brushing and flossing.  Dark  spots or blotches (chloasma, mask of pregnancy) may develop on your face. This will likely fade after the baby is born.  Your menstrual periods will stop.  You may have a loss of appetite.  You may develop cravings for certain kinds of food.  You may have changes in your emotions from day to day, such as being excited to be pregnant or being concerned that something may go wrong with the pregnancy and baby.  You may have more vivid and strange dreams.  You may have changes in your hair. These can include thickening of your hair, rapid growth, and changes in texture. Some women also have hair loss during or after pregnancy, or hair that feels dry or thin. Your hair will most likely return to normal after your baby is born. What to expect at prenatal visits During a routine prenatal visit:  You will be weighed to make sure you and the baby are growing normally.  Your blood pressure will be taken.  Your abdomen will be measured to track your baby's growth.  The fetal heartbeat will be listened to between weeks 10 and 14 of your pregnancy.  Test results from any previous visits will be discussed. Your health care provider may ask you:  How you are feeling.  If you are feeling the baby move.  If you have had any abnormal symptoms, such as leaking fluid, bleeding, severe headaches, or abdominal cramping.  If you are using any tobacco products, including cigarettes, chewing tobacco, and electronic cigarettes.  If you have any questions. Other tests that may be performed during your first trimester include:  Blood tests to find your blood type and to check for the presence of any previous infections. The tests will also be used to check for low iron levels (anemia) and protein on red blood cells (Rh antibodies). Depending on your risk factors, or if you previously had diabetes during pregnancy, you may have tests to check for high blood sugar that affects pregnant women (gestational  diabetes).  Urine tests to check for infections,  diabetes, or protein in the urine.  An ultrasound to confirm the proper growth and development of the baby.  Fetal screens for spinal cord problems (spina bifida) and Down syndrome.  HIV (human immunodeficiency virus) testing. Routine prenatal testing includes screening for HIV, unless you choose not to have this test.  You may need other tests to make sure you and the baby are doing well. Follow these instructions at home: Medicines   Follow your health care provider's instructions regarding medicine use. Specific medicines may be either safe or unsafe to take during pregnancy.  Take a prenatal vitamin that contains at least 600 micrograms (mcg) of folic acid.  If you develop constipation, try taking a stool softener if your health care provider approves. Eating and drinking   Eat a balanced diet that includes fresh fruits and vegetables, whole grains, good sources of protein such as meat, eggs, or tofu, and low-fat dairy. Your health care provider will help you determine the amount of weight gain that is right for you.  Avoid raw meat and uncooked cheese. These carry germs that can cause birth defects in the baby.  Eating four or five small meals rather than three large meals a day may help relieve nausea and vomiting. If you start to feel nauseous, eating a few soda crackers can be helpful. Drinking liquids between meals, instead of during meals, also seems to help ease nausea and vomiting.  Limit foods that are high in fat and processed sugars, such as fried and sweet foods.  To prevent constipation:  Eat foods that are high in fiber, such as fresh fruits and vegetables, whole grains, and beans.  Drink enough fluid to keep your urine clear or pale yellow. Activity   Exercise only as directed by your health care provider. Most women can continue their usual exercise routine during pregnancy. Try to exercise for 30 minutes at  least 5 days a week. Exercising will help you:  Control your weight.  Stay in shape.  Be prepared for labor and delivery.  Experiencing pain or cramping in the lower abdomen or lower back is a good sign that you should stop exercising. Check with your health care provider before continuing with normal exercises.  Try to avoid standing for long periods of time. Move your legs often if you must stand in one place for a long time.  Avoid heavy lifting.  Wear low-heeled shoes and practice good posture.  You may continue to have sex unless your health care provider tells you not to. Relieving pain and discomfort   Wear a good support bra to relieve breast tenderness.  Take warm sitz baths to soothe any pain or discomfort caused by hemorrhoids. Use hemorrhoid cream if your health care provider approves.  Rest with your legs elevated if you have leg cramps or low back pain.  If you develop varicose veins in your legs, wear support hose. Elevate your feet for 15 minutes, 3-4 times a day. Limit salt in your diet. Prenatal care   Schedule your prenatal visits by the twelfth week of pregnancy. They are usually scheduled monthly at first, then more often in the last 2 months before delivery.  Write down your questions. Take them to your prenatal visits.  Keep all your prenatal visits as told by your health care provider. This is important. Safety   Wear your seat belt at all times when driving.  Make a list of emergency phone numbers, including numbers for family, friends, the hospital, and  police and Public relations account executive. General instructions   Ask your health care provider for a referral to a local prenatal education class. Begin classes no later than the beginning of month 6 of your pregnancy.  Ask for help if you have counseling or nutritional needs during pregnancy. Your health care provider can offer advice or refer you to specialists for help with various needs.  Do not use hot  tubs, steam rooms, or saunas.  Do not douche or use tampons or scented sanitary pads.  Do not cross your legs for long periods of time.  Avoid cat litter boxes and soil used by cats. These carry germs that can cause birth defects in the baby and possibly loss of the fetus by miscarriage or stillbirth.  Avoid all smoking, herbs, alcohol, and medicines not prescribed by your health care provider. Chemicals in these products affect the formation and growth of the baby.  Do not use any products that contain nicotine or tobacco, such as cigarettes and e-cigarettes. If you need help quitting, ask your health care provider. You may receive counseling support and other resources to help you quit.  Schedule a dentist appointment. At home, brush your teeth with a soft toothbrush and be gentle when you floss. Contact a health care provider if:  You have dizziness.  You have mild pelvic cramps, pelvic pressure, or nagging pain in the abdominal area.  You have persistent nausea, vomiting, or diarrhea.  You have a bad smelling vaginal discharge.  You have pain when you urinate.  You notice increased swelling in your face, hands, legs, or ankles.  You are exposed to fifth disease or chickenpox.  You are exposed to [place] measles (rubella) and have never had it. Get help right away if:  You have a fever.  You are leaking fluid from your vagina.  You have spotting or bleeding from your vagina.  You have severe abdominal cramping or pain.  You have rapid weight gain or loss.  You vomit blood or material that looks like coffee grounds.  You develop a severe headache.  You have shortness of breath.  You have any kind of trauma, such as from a fall or a car accident. Summary  The first trimester of pregnancy is from week 1 until the end of week 13 (months 1 through 3).  Your body goes through many changes during pregnancy. The changes vary from woman to woman.  You will have routine  prenatal visits. During those visits, your health care provider will examine you, discuss any test results you may have, and talk with you about how you are feeling. This information is not intended to replace advice given to you by your health care provider. Make sure you discuss any questions you have with your health care provider. Document Released: 04/07/2001 Document Revised: 03/25/2016 Document Reviewed: 03/25/2016 Elsevier Interactive Patient Education  2017 [place]. Common Medications Safe in Pregnancy  Acne:      Constipation:  Benzoyl Peroxide     Colace  Clindamycin      Dulcolax Suppository  Topica Erythromycin     Fibercon  Salicylic Acid      Metamucil         Miralax AVOID:        Senakot   Accutane    Cough:  Retin-A       Cough Drops  Tetracycline      Phenergan w/ Codeine if Rx  Minocycline      Robitussin (Plain & DM)  Antibiotics:     Crabs/Lice:  Ceclor       RID  Cephalosporins    AVOID:  E-Mycins      Kwell  Keflex  Macrobid/Macrodantin   Diarrhea:  Penicillin      Kao-Pectate  Zithromax      Imodium AD         PUSH FLUIDS AVOID:       Cipro     Fever:  Tetracycline      Tylenol (Regular or Extra  Minocycline       Strength)  Levaquin      Extra Strength-Do not          Exceed 8 tabs/24 hrs Caffeine:        <227m/day (equiv. To 1 cup of coffee or  approx. 3 12 oz sodas)         Gas: Cold/Hayfever:       Gas-X  Benadryl      Mylicon  Claritin       Phazyme  **Claritin-D        Chlor-Trimeton    Headaches:  Dimetapp      ASA-Free Excedrin  Drixoral-Non-Drowsy     Cold Compress  Mucinex (Guaifenasin)     Tylenol (Regular or Extra  Sudafed/Sudafed-12 Hour     Strength)  **Sudafed PE Pseudoephedrine   Tylenol Cold & Sinus     Vicks Vapor Rub  Zyrtec  **AVOID if Problems With Blood Pressure         Heartburn: Avoid lying down for at least 1 hour after meals  Aciphex      Maalox     Rash:  Milk of  Magnesia     Benadryl    Mylanta       1% Hydrocortisone Cream  Pepcid  Pepcid Complete   Sleep Aids:  Prevacid      Ambien   Prilosec       Benadryl  Rolaids       Chamomile Tea  Tums (Limit 4/day)     Unisom  Zantac       Tylenol PM         Warm milk-add vanilla or  Hemorrhoids:       Sugar for taste  Anusol/Anusol H.C.  (RX: Analapram 2.5%)  Sugar Substitutes:  Hydrocortisone OTC     Ok in moderation  Preparation H      Tucks        Vaseline lotion applied to tissue with wiping    Herpes:     Throat:  Acyclovir      Oragel  Famvir  Valtrex     Vaccines:         Flu Shot Leg Cramps:       *Gardasil  Benadryl      Hepatitis A         Hepatitis B Nasal Spray:       Pneumovax  Saline Nasal Spray     Polio Booster         Tetanus Nausea:       Tuberculosis test or PPD  Vitamin B6 25 mg TID   AVOID:    Dramamine      *Gardasil  Emetrol       Live Poliovirus  Ginger Root 250 mg QID    MMR (measles, mumps &  High Complex Carbs @ Bedtime    rebella)  Sea Bands-Accupressure    Varicella (Chickenpox)  Unisom 1/2 tab TID     *  No known complications           If received before Pain:         Known pregnancy;   Darvocet       Resume series after  Lortab        Delivery  Percocet    Yeast:   Tramadol      Femstat  Tylenol 3      Gyne-lotrimin  Ultram       Monistat  Vicodin           MISC:         All Sunscreens           Hair Coloring/highlights          Insect Repellant's          (Including DEET)         Mystic Tans

## 2016-09-17 NOTE — Progress Notes (Signed)
Lars Massonachel Simi presents for NOB nurse interview. [facility] Dating scan complete(mrr signed for u/s report)  on 09/04/2016 at care center for unsure lmp. SIUP at 7 5/7 with EDD 04/19/2017. G4 P3003.  Pregnancy education material explained and given. _0__ cats in the home. NOB labs ordered. HIV labs and Drug screen were explained and ordered.  PNV encouraged.  Mild nausea. No meds needed at this time. Pt has experienced mild heart burn type symptoms. Advised tums. Decrease fatty fried foods. Increase h20 intake. Last pap 04/2013 neg/neg. Genetic screening options discussed. Genetic testing: to discuss with provider.  Pt. To follow up with provider in  3_ weeks for NOB physical.  All questions answered.

## 2016-09-18 LAB — ABO AND RH: Rh Factor: POSITIVE

## 2016-09-18 LAB — CBC WITH DIFFERENTIAL/PLATELET
BASOS ABS: 0 10*3/uL (ref 0.0–0.2)
Basos: 0 %
EOS (ABSOLUTE): 0.2 10*3/uL (ref 0.0–0.4)
Eos: 2 %
Hematocrit: 39.3 % (ref 34.0–46.6)
Hemoglobin: 13.4 g/dL (ref 11.1–15.9)
IMMATURE GRANS (ABS): 0 10*3/uL (ref 0.0–0.1)
IMMATURE GRANULOCYTES: 0 %
Lymphocytes Absolute: 2.1 10*3/uL (ref 0.7–3.1)
Lymphs: 23 %
MCH: 27.6 pg (ref 26.6–33.0)
MCHC: 34.1 g/dL (ref 31.5–35.7)
MCV: 81 fL (ref 79–97)
MONOS ABS: 0.8 10*3/uL (ref 0.1–0.9)
Monocytes: 9 %
NEUTROS PCT: 66 %
Neutrophils Absolute: 6 10*3/uL (ref 1.4–7.0)
PLATELETS: 325 10*3/uL (ref 150–379)
RBC: 4.85 x10E6/uL (ref 3.77–5.28)
RDW: 14.6 % (ref 12.3–15.4)
WBC: 9.1 10*3/uL (ref 3.4–10.8)

## 2016-09-18 LAB — DRUG PROFILE, UR, 9 DRUGS (LABCORP)
Amphetamines, Urine: NEGATIVE ng/mL
Barbiturate Quant, Ur: NEGATIVE ng/mL
Benzodiazepine Quant, Ur: NEGATIVE ng/mL
Cannabinoid Quant, Ur: NEGATIVE ng/mL
Cocaine (Metab.): NEGATIVE ng/mL
Methadone Screen, Urine: NEGATIVE ng/mL
Opiate Quant, Ur: NEGATIVE ng/mL
PCP Quant, Ur: NEGATIVE ng/mL
Propoxyphene: NEGATIVE ng/mL

## 2016-09-18 LAB — URINALYSIS, ROUTINE W REFLEX MICROSCOPIC
BILIRUBIN UA: NEGATIVE
Glucose, UA: NEGATIVE
Ketones, UA: NEGATIVE
Leukocytes, UA: NEGATIVE
Nitrite, UA: NEGATIVE
PH UA: 6.5 (ref 5.0–7.5)
Protein, UA: NEGATIVE
Specific Gravity, UA: 1.014 (ref 1.005–1.030)
UUROB: 0.2 mg/dL (ref 0.2–1.0)

## 2016-09-18 LAB — HIV ANTIBODY (ROUTINE TESTING W REFLEX): HIV SCREEN 4TH GENERATION: NONREACTIVE

## 2016-09-18 LAB — MICROSCOPIC EXAMINATION
Bacteria, UA: NONE SEEN
Casts: NONE SEEN /lpf
Epithelial Cells (non renal): NONE SEEN /hpf (ref 0–10)

## 2016-09-18 LAB — NICOTINE SCREEN, URINE: Cotinine Ql Scrn, Ur: NEGATIVE ng/mL

## 2016-09-18 LAB — RPR: RPR: NONREACTIVE

## 2016-09-18 LAB — HEPATITIS B SURFACE ANTIGEN: HEP B S AG: NEGATIVE

## 2016-09-18 LAB — ANTIBODY SCREEN: Antibody Screen: NEGATIVE

## 2016-09-18 LAB — VARICELLA ZOSTER ANTIBODY, IGG: VARICELLA: 976 {index} (ref >165)

## 2016-09-18 LAB — RUBELLA SCREEN: RUBELLA: 4.92 {index} (ref >0.99)

## 2016-09-18 NOTE — Progress Notes (Signed)
I have reviewed the record and concur with documentation as noted by Darol Destinerystal Miller, CMA.   Hildred Laserherry, Feliz Lincoln, MD [facility]

## 2016-09-19 LAB — URINE CULTURE, OB REFLEX: Organism ID, Bacteria: NO GROWTH

## 2016-09-19 LAB — CULTURE, OB URINE

## 2016-09-20 LAB — GC/CHLAMYDIA PROBE AMP
Chlamydia trachomatis, NAA: NEGATIVE
Neisseria gonorrhoeae by PCR: NEGATIVE

## 2016-10-09 ENCOUNTER — Ambulatory Visit (INDEPENDENT_AMBULATORY_CARE_PROVIDER_SITE_OTHER): Payer: Medicaid Other | Admitting: Obstetrics and Gynecology

## 2016-10-09 VITALS — BP 113/68 | HR 97 | Wt 149.8 lb

## 2016-10-09 DIAGNOSIS — Z3482 Encounter for supervision of other normal pregnancy, second trimester: Secondary | ICD-10-CM | POA: Diagnosis not present

## 2016-10-09 DIAGNOSIS — O34219 Maternal care for unspecified type scar from previous cesarean delivery: Secondary | ICD-10-CM

## 2016-10-09 DIAGNOSIS — Z124 Encounter for screening for malignant neoplasm of cervix: Secondary | ICD-10-CM

## 2016-10-09 DIAGNOSIS — O09522 Supervision of elderly multigravida, second trimester: Secondary | ICD-10-CM

## 2016-10-09 DIAGNOSIS — Z1379 Encounter for other screening for genetic and chromosomal anomalies: Secondary | ICD-10-CM

## 2016-10-09 LAB — POCT URINALYSIS DIPSTICK
Bilirubin, UA: NEGATIVE
Glucose, UA: NEGATIVE
Ketones, UA: NEGATIVE
LEUKOCYTES UA: NEGATIVE
Nitrite, UA: NEGATIVE
PH UA: 6 (ref 5.0–8.0)
Spec Grav, UA: >=1.03 — AB (ref 1.010–1.025)
Urobilinogen, UA: 0.2 E.U./dL

## 2016-10-09 NOTE — Progress Notes (Signed)
OBSTETRIC INITIAL PRENATAL VISIT  Subjective:    Leni Pankonin is being seen today for her first obstetrical visit.  This is not a planned pregnancy. She is a [id number] female at [redacted]w[redacted]d gestation, Estimated Date of Delivery: 04/19/17 with Patient's last menstrual period was 07/13/2016. (consistent with 7 week sono). Her obstetrical history is significant for advanced maternal age and h/o C-section x 3. Relationship with FOB: spouse, living together. Patient does intend to breast feed. Pregnancy history fully reviewed.    Obstetric History   G4   P3   T3   P0   A0   L3    SAB0   TAB0   Ectopic0   Multiple0   Live Births3     # Outcome Date GA Lbr Len/2nd Weight Sex Delivery Anes PTL Lv  4 Current           3 Term 2009   6 lb 1.8 oz (2.771 kg) M CS-LTranv   LIV  2 Term 2005   7 lb 14.4 oz (3.583 kg) M CS-LTranv   LIV  1 Term 2004   6 lb 2.2 oz (2.785 kg) M CS-LTranv   LIV     Complications: Failure to progress in labor      Gynecologic History:  Last pap smear was 5 years ago.  Results were normal.  Denies h/o abnormal pap smears in the past.  Denies history of STIs.  Contraception: None  Past Medical History:  Diagnosis Date  . Anxiety   . Blood in stool 07/2009  . Depression   . FHx: migraine headaches   . Vaginal itching 07/2009     Family History  Problem Relation Age of Onset  . Cancer Maternal Grandfather   . Hearing loss Maternal Grandfather   . Diabetes Father   . Liver disease Father   . Hyperlipidemia Father   . Arthritis Mother   . Hearing loss Mother   . Arthritis Maternal Grandmother   . COPD Paternal Grandmother        fluid in lungs  . Hyperlipidemia Sister   . Diabetes Paternal Aunt   . Hypertension Paternal Aunt   . Hyperlipidemia Paternal Aunt   . Diabetes Paternal Uncle   . Hypertension Paternal Uncle   . Hyperlipidemia Paternal Uncle   . Breast cancer Neg Hx   . Ovarian cancer Neg Hx   . Colon cancer Neg Hx      Past Surgical History:    Procedure Laterality Date  . CESAREAN SECTION  2004 , 2005 and 2009   x 3  . TYMPANOSTOMY TUBE PLACEMENT  06/1997, 11/1998, 01/2001  . WISDOM TOOTH EXTRACTION     x4     Social History   Social History  . Marital status: Married    Spouse name: N/A  . Number of children: N/A  . Years of education: N/A   Occupational History  . Not on file.   Social History Main Topics  . Smoking status: Never Smoker  . Smokeless tobacco: Never Used  . Alcohol use No  . Drug use: No  . Sexual activity: Yes    Partners: Male    Birth control/ protection: None   Other Topics Concern  . Not on file   Social History Narrative  . No narrative on file     Current Outpatient Prescriptions on File Prior to Visit  Medication Sig Dispense Refill  . Prenatal Vit-Fe Fumarate-FA (PRENATAL MULTIVITAMIN) TABS tablet Take 1 tablet by mouth  daily at 12 noon.     No current facility-administered medications on file prior to visit.      Allergies  Allergen Reactions  . Iodine      Review of Systems General:Not Present- Fever, Weight Loss and Weight Gain. Skin:Not Present- Rash. HEENT:Not Present- Blurred Vision, Headache and Bleeding Gums. Respiratory:Not Present- Difficulty Breathing. Breast:Not Present- Breast Mass. Cardiovascular:Not Present- Chest Pain, Elevated Blood Pressure, Fainting / Blacking Out and Shortness of Breath. Gastrointestinal:Not Present- Abdominal Pain, Constipation, Nausea and Vomiting. Female Genitourinary:Not Present- Frequency, Painful Urination, Pelvic Pain, Vaginal Bleeding, Vaginal Discharge, Contractions, regular, Fetal Movements Decreased, Urinary Complaints and Vaginal Fluid. Musculoskeletal:Not Present- Back Pain and Leg Cramps. Neurological:Not Present- Dizziness. Psychiatric:Not Present- Depression.     Objective:   Blood pressure 113/68, pulse 97, weight 149 lb 12.8 oz (67.9 kg), last menstrual period 07/13/2016.  Body mass index is 24.18  kg/m.  General Appearance:    Alert, cooperative, no distress, appears stated age  Head:    Normocephalic, without obvious abnormality, atraumatic  Eyes:    PERRL, conjunctiva/corneas clear, EOM's intact, both eyes  Ears:    Normal external ear canals, both ears  Nose:   Nares normal, septum midline, mucosa normal, no drainage or sinus tenderness  Throat:   Lips, mucosa, and tongue normal; teeth and gums normal  Neck:   Supple, symmetrical, trachea midline, no adenopathy; thyroid: no enlargement/tenderness/nodules; no carotid bruit or JVD  Back:     Symmetric, no curvature, ROM normal, no CVA tenderness  Lungs:     Clear to auscultation bilaterally, respirations unlabored  Chest Wall:    No tenderness or deformity   Heart:    Regular rate and rhythm, S1 and S2 normal, no murmur, rub or gallop  Breast Exam:    No tenderness, masses, or nipple abnormality  Abdomen:     Soft, non-tender, bowel sounds active all four quadrants, no masses, no organomegaly.  FH 12.  FHT 156  bpm.  Genitalia:    Pelvic:external genitalia normal, vagina without lesions, discharge, or tenderness, rectovaginal septum  normal. Cervix normal in appearance, no cervical motion tenderness, no adnexal masses or tenderness.  Pregnancy positive findings: uterine enlargement: 12 wk size, nontender.   Rectal:    Normal external sphincter.  No hemorrhoids appreciated. Internal exam not done.   Extremities:   Extremities normal, atraumatic, no cyanosis or edema  Pulses:   2+ and symmetric all extremities  Skin:   Skin color, texture, turgor normal, no rashes or lesions  Lymph nodes:   Cervical, supraclavicular, and axillary nodes normal  Neurologic:   CNII-XII intact, normal strength, sensation and reflexes throughout     Assessment:   Pregnancy at 12 and 4/7 weeks   Advanced maternal age H/o C-section x 3   Plan:   Initial labs reviewed. Pap smear performed today.  Prenatal vitamins encouraged. Problem list reviewed  and updated. New OB counseling:  The patient has been given an overview regarding routine prenatal care.  Recommendations regarding diet, weight gain, and exercise in pregnancy were given. Prenatal testing, optional genetic testing, and ultrasound use in pregnancy were reviewed.  1st and second trimester testing, as well as cell-free DNA was discussed: requested cell-free DNA testing with MaterniT21.  Will perform today.  Benefits of Breast Feeding were discussed. The patient is encouraged to consider nursing her baby post partum. H/o C-section x 3.  Patient will need repeat C-section as mode of delivery. Discussed with patient who is in agreement. Will likely  also desire BTL at time of surgery.  Follow up in 4 weeks.  50% of 30 min visit spent on counseling and coordination of care.    Hildred Laserherry, Trude Cansler, MD [facility]

## 2016-10-09 NOTE — Patient Instructions (Signed)

## 2016-10-10 DIAGNOSIS — O34219 Maternal care for unspecified type scar from previous cesarean delivery: Secondary | ICD-10-CM | POA: Insufficient documentation

## 2016-10-10 DIAGNOSIS — O09522 Supervision of elderly multigravida, second trimester: Secondary | ICD-10-CM | POA: Insufficient documentation

## 2016-10-10 DIAGNOSIS — Z3482 Encounter for supervision of other normal pregnancy, second trimester: Secondary | ICD-10-CM | POA: Insufficient documentation

## 2016-10-13 LAB — PAP IG AND HPV HIGH-RISK
HPV, HIGH-RISK: NEGATIVE
PAP Smear Comment: 0

## 2016-10-16 LAB — MATERNIT21  PLUS CORE+ESS+SCA, BLOOD
CHROMOSOME 13: NEGATIVE
CHROMOSOME 18: NEGATIVE
CHROMOSOME 21: NEGATIVE
Y CHROMOSOME: DETECTED

## 2016-10-19 ENCOUNTER — Telehealth: Payer: Self-pay

## 2016-10-19 NOTE — Telephone Encounter (Signed)
-----   Message from Hildred LaserAnika Cherry, MD sent at 10/17/2016  8:10 PM EDT ----- Please inform of normal MaterniT21, is a female fetus if sex is desired.

## 2016-10-19 NOTE — Telephone Encounter (Signed)
Called pt informed her of genetic results, pt desires to find out sex of baby with husband, will place in envelope and leave at the front desk.

## 2016-11-05 ENCOUNTER — Telehealth: Payer: Self-pay | Admitting: Obstetrics and Gynecology

## 2016-11-05 NOTE — Telephone Encounter (Signed)
Patient called and lvm for a nurse to call her back. She had some concerns and questions she wanted to speak with someone about, Patient did not disclose any other information. Please advise.

## 2016-11-09 ENCOUNTER — Encounter: Payer: Self-pay | Admitting: Obstetrics and Gynecology

## 2016-11-09 ENCOUNTER — Ambulatory Visit (INDEPENDENT_AMBULATORY_CARE_PROVIDER_SITE_OTHER): Payer: Medicaid Other | Admitting: Obstetrics and Gynecology

## 2016-11-09 ENCOUNTER — Other Ambulatory Visit: Payer: Self-pay | Admitting: Obstetrics and Gynecology

## 2016-11-09 VITALS — BP 92/60 | HR 93 | Wt 157.3 lb

## 2016-11-09 DIAGNOSIS — Z3492 Encounter for supervision of normal pregnancy, unspecified, second trimester: Secondary | ICD-10-CM

## 2016-11-09 DIAGNOSIS — O09522 Supervision of elderly multigravida, second trimester: Secondary | ICD-10-CM

## 2016-11-09 DIAGNOSIS — O34219 Maternal care for unspecified type scar from previous cesarean delivery: Secondary | ICD-10-CM

## 2016-11-09 LAB — POCT URINALYSIS DIPSTICK
Bilirubin, UA: NEGATIVE
Glucose, UA: NEGATIVE
Ketones, UA: NEGATIVE
LEUKOCYTES UA: NEGATIVE
NITRITE UA: NEGATIVE
PH UA: 7.5 (ref 5.0–8.0)
Spec Grav, UA: 1.01 (ref 1.010–1.025)
UROBILINOGEN UA: NEGATIVE U/dL — AB

## 2016-11-09 NOTE — Progress Notes (Signed)
ROB:  AFP today, U/S FAS scheduled.  Urine for C&S.  PT to try Monostat for Monilia.  Pt desires repeat CD and tubal.  Discussed timing of CD.  Discussed anterior placenta and accreta with repeat CD.

## 2016-11-09 NOTE — Addendum Note (Signed)
Addended by: Brooke DareSICK, Amelya Mabry L on: 11/09/2016 02:43 PM   Modules accepted: Orders

## 2016-11-11 LAB — URINE CULTURE: Organism ID, Bacteria: NO GROWTH

## 2016-11-17 LAB — AFP, SERUM, OPEN SPINA BIFIDA
AFP MOM: 1.73
AFP Value: 60 ng/mL
Gest. Age on Collection Date: 16.6 weeks
Maternal Age At EDD: 36 yr
OSBR Risk 1 IN: 1514
TEST RESULTS AFP: NEGATIVE
Weight: 157 [lb_av]

## 2016-11-19 ENCOUNTER — Telehealth: Payer: Self-pay

## 2016-11-19 NOTE — Telephone Encounter (Signed)
-----   Message from Linzie Collinavid James Evans, MD sent at 11/18/2016  3:13 PM EDT ----- Fetal Screening test is negative for abnormalities.

## 2016-11-19 NOTE — Telephone Encounter (Signed)
mychart message sent

## 2016-11-24 ENCOUNTER — Ambulatory Visit (INDEPENDENT_AMBULATORY_CARE_PROVIDER_SITE_OTHER): Payer: Medicaid Other

## 2016-11-24 DIAGNOSIS — Z3492 Encounter for supervision of normal pregnancy, unspecified, second trimester: Secondary | ICD-10-CM

## 2016-11-25 ENCOUNTER — Other Ambulatory Visit: Payer: Self-pay | Admitting: Obstetrics and Gynecology

## 2016-11-25 DIAGNOSIS — IMO0002 Reserved for concepts with insufficient information to code with codable children: Secondary | ICD-10-CM

## 2016-11-25 DIAGNOSIS — Z0489 Encounter for examination and observation for other specified reasons: Secondary | ICD-10-CM

## 2016-12-08 ENCOUNTER — Encounter: Payer: Medicaid Other | Admitting: Obstetrics and Gynecology

## 2016-12-09 ENCOUNTER — Ambulatory Visit (INDEPENDENT_AMBULATORY_CARE_PROVIDER_SITE_OTHER): Payer: Medicaid Other

## 2016-12-09 ENCOUNTER — Encounter: Payer: Self-pay | Admitting: Obstetrics and Gynecology

## 2016-12-09 ENCOUNTER — Ambulatory Visit (INDEPENDENT_AMBULATORY_CARE_PROVIDER_SITE_OTHER): Payer: Medicaid Other | Admitting: Obstetrics and Gynecology

## 2016-12-09 VITALS — BP 105/67 | HR 82 | Wt 160.9 lb

## 2016-12-09 DIAGNOSIS — IMO0002 Reserved for concepts with insufficient information to code with codable children: Secondary | ICD-10-CM

## 2016-12-09 DIAGNOSIS — Z048 Encounter for examination and observation for other specified reasons: Secondary | ICD-10-CM | POA: Diagnosis not present

## 2016-12-09 DIAGNOSIS — Z0489 Encounter for examination and observation for other specified reasons: Secondary | ICD-10-CM

## 2016-12-09 DIAGNOSIS — R319 Hematuria, unspecified: Secondary | ICD-10-CM

## 2016-12-09 DIAGNOSIS — Z3492 Encounter for supervision of normal pregnancy, unspecified, second trimester: Secondary | ICD-10-CM

## 2016-12-09 DIAGNOSIS — O34219 Maternal care for unspecified type scar from previous cesarean delivery: Secondary | ICD-10-CM

## 2016-12-09 LAB — POCT URINALYSIS DIPSTICK
Bilirubin, UA: NEGATIVE
GLUCOSE UA: NEGATIVE
KETONES UA: NEGATIVE
Leukocytes, UA: NEGATIVE
Nitrite, UA: NEGATIVE
Protein, UA: NEGATIVE
SPEC GRAV UA: 1.02 (ref 1.010–1.025)
Urobilinogen, UA: 0.2 E.U./dL
pH, UA: 6 (ref 5.0–8.0)

## 2016-12-09 NOTE — Addendum Note (Signed)
Addended by: Marchelle FolksMILLER, Sanjeev Main G on: 12/09/2016 11:48 AM   Modules accepted: Orders

## 2016-12-09 NOTE — Progress Notes (Signed)
ROB- f/u anatomy. AFP_ neg.

## 2016-12-09 NOTE — Progress Notes (Signed)
ROB: Doing well, complains of mild left sided back pain under rib. Notes having long car ride on Sunday, wonders if this could be related. Using a soft pillow behind her back makes it better. Also offered warm/cold compresses.  Normal f/u anatomy scan.  Discussed glucola testing, patient notes that she typically has vomited three different occasions with traditional glucola testing in her 1st pregnancy, and for her subsequent pregnancies has done the jelly bean testing. Patient given a list of acceptable alternatives to bring for testing at 28 weeks. Hematuria noted today, will send for culture. RTC in 4 weeks.

## 2016-12-11 LAB — URINE CULTURE: Organism ID, Bacteria: NO GROWTH

## 2017-01-03 ENCOUNTER — Encounter: Payer: Self-pay | Admitting: Obstetrics and Gynecology

## 2017-01-06 ENCOUNTER — Encounter: Payer: Self-pay | Admitting: Obstetrics and Gynecology

## 2017-01-06 ENCOUNTER — Ambulatory Visit (INDEPENDENT_AMBULATORY_CARE_PROVIDER_SITE_OTHER): Payer: Medicaid Other | Admitting: Obstetrics and Gynecology

## 2017-01-06 VITALS — BP 119/70 | HR 96 | Wt 166.5 lb

## 2017-01-06 DIAGNOSIS — O34219 Maternal care for unspecified type scar from previous cesarean delivery: Secondary | ICD-10-CM

## 2017-01-06 DIAGNOSIS — Z3482 Encounter for supervision of other normal pregnancy, second trimester: Secondary | ICD-10-CM

## 2017-01-06 LAB — POCT URINALYSIS DIPSTICK
Bilirubin, UA: NEGATIVE
GLUCOSE UA: NEGATIVE
Ketones, UA: NEGATIVE
Leukocytes, UA: NEGATIVE
NITRITE UA: NEGATIVE
Protein, UA: NEGATIVE
SPEC GRAV UA: 1.02 (ref 1.010–1.025)
UROBILINOGEN UA: 0.2 U/dL
pH, UA: 6.5 (ref 5.0–8.0)

## 2017-01-06 NOTE — Progress Notes (Signed)
ROB:  No complaints. Feeling the baby move daily.  Patient planning to do alternative Glucola testing with next visit.

## 2017-01-27 ENCOUNTER — Other Ambulatory Visit: Payer: Medicaid Other

## 2017-01-27 ENCOUNTER — Ambulatory Visit (INDEPENDENT_AMBULATORY_CARE_PROVIDER_SITE_OTHER): Payer: Medicaid Other | Admitting: Obstetrics and Gynecology

## 2017-01-27 ENCOUNTER — Encounter: Payer: Self-pay | Admitting: Obstetrics and Gynecology

## 2017-01-27 VITALS — BP 106/71 | HR 97 | Wt 166.7 lb

## 2017-01-27 DIAGNOSIS — Z3482 Encounter for supervision of other normal pregnancy, second trimester: Secondary | ICD-10-CM

## 2017-01-27 DIAGNOSIS — Z131 Encounter for screening for diabetes mellitus: Secondary | ICD-10-CM

## 2017-01-27 DIAGNOSIS — O34219 Maternal care for unspecified type scar from previous cesarean delivery: Secondary | ICD-10-CM

## 2017-01-27 DIAGNOSIS — O09522 Supervision of elderly multigravida, second trimester: Secondary | ICD-10-CM

## 2017-01-27 DIAGNOSIS — Z23 Encounter for immunization: Secondary | ICD-10-CM | POA: Diagnosis not present

## 2017-01-27 DIAGNOSIS — Z130 Encounter for screening for diseases of the blood and blood-forming organs and certain disorders involving the immune mechanism: Secondary | ICD-10-CM

## 2017-01-27 DIAGNOSIS — R319 Hematuria, unspecified: Secondary | ICD-10-CM

## 2017-01-27 LAB — POCT URINALYSIS DIPSTICK
Bilirubin, UA: NEGATIVE
Glucose, UA: NEGATIVE
Ketones, UA: NEGATIVE
LEUKOCYTES UA: NEGATIVE
Nitrite, UA: NEGATIVE
PH UA: 7 (ref 5.0–8.0)
PROTEIN UA: NEGATIVE
Spec Grav, UA: 1.02 (ref 1.010–1.025)
Urobilinogen, UA: 0.2 E.U./dL

## 2017-01-27 NOTE — Progress Notes (Signed)
ROB- gtt, cbc,BTC,  tdap and flu vaccine today.

## 2017-01-27 NOTE — Progress Notes (Signed)
ROB: Doing well, no complaints. For 28 week labs today.  Desires to breastfeed,  desires BTL at time of C-section for contraception. For Tdap today, signed blood consent, discussed cord blood banking. Tubal papers signed today. RTC in 2 weeks.

## 2017-01-27 NOTE — Addendum Note (Signed)
Addended by: Marchelle Folks on: 01/27/2017 01:23 PM   Modules accepted: Orders

## 2017-01-28 LAB — CBC
HEMATOCRIT: 33.9 % — AB (ref 34.0–46.6)
HEMOGLOBIN: 11.2 g/dL (ref 11.1–15.9)
MCH: 29.2 pg (ref 26.6–33.0)
MCHC: 33 g/dL (ref 31.5–35.7)
MCV: 89 fL (ref 79–97)
Platelets: 351 10*3/uL (ref 150–379)
RBC: 3.83 x10E6/uL (ref 3.77–5.28)
RDW: 12.8 % (ref 12.3–15.4)
WBC: 9.5 10*3/uL (ref 3.4–10.8)

## 2017-01-28 LAB — GLUCOSE, 1 HOUR GESTATIONAL: Gestational Diabetes Screen: 136 mg/dL (ref 65–139)

## 2017-01-29 LAB — URINE CULTURE: Organism ID, Bacteria: NO GROWTH

## 2017-02-10 ENCOUNTER — Ambulatory Visit (INDEPENDENT_AMBULATORY_CARE_PROVIDER_SITE_OTHER): Payer: Medicaid Other | Admitting: Obstetrics and Gynecology

## 2017-02-10 VITALS — BP 113/79 | HR 85 | Wt 174.5 lb

## 2017-02-10 DIAGNOSIS — Z3493 Encounter for supervision of normal pregnancy, unspecified, third trimester: Secondary | ICD-10-CM

## 2017-02-10 LAB — POCT URINALYSIS DIPSTICK
BILIRUBIN UA: NEGATIVE
GLUCOSE UA: NEGATIVE
KETONES UA: NEGATIVE
Leukocytes, UA: NEGATIVE
Nitrite, UA: NEGATIVE
Protein, UA: NEGATIVE
SPEC GRAV UA: 1.01 (ref 1.010–1.025)
Urobilinogen, UA: 0.2 E.U./dL
pH, UA: 8 (ref 5.0–8.0)

## 2017-02-10 NOTE — Progress Notes (Signed)
ROB:  Considering CD for 12-17 - not scheduled yet.  No problems today.

## 2017-02-10 NOTE — Progress Notes (Signed)
ROB- pt is doing well, denies any complaints 

## 2017-02-11 ENCOUNTER — Encounter: Payer: Self-pay | Admitting: Obstetrics and Gynecology

## 2017-02-24 ENCOUNTER — Ambulatory Visit (INDEPENDENT_AMBULATORY_CARE_PROVIDER_SITE_OTHER): Payer: Medicaid Other | Admitting: Obstetrics and Gynecology

## 2017-02-24 ENCOUNTER — Encounter: Payer: Self-pay | Admitting: Obstetrics and Gynecology

## 2017-02-24 VITALS — BP 123/82 | HR 80 | Wt 181.5 lb

## 2017-02-24 DIAGNOSIS — O34219 Maternal care for unspecified type scar from previous cesarean delivery: Secondary | ICD-10-CM

## 2017-02-24 DIAGNOSIS — O09523 Supervision of elderly multigravida, third trimester: Secondary | ICD-10-CM

## 2017-02-24 DIAGNOSIS — G5600 Carpal tunnel syndrome, unspecified upper limb: Secondary | ICD-10-CM

## 2017-02-24 DIAGNOSIS — O26899 Other specified pregnancy related conditions, unspecified trimester: Secondary | ICD-10-CM

## 2017-02-24 LAB — POCT URINALYSIS DIPSTICK
BILIRUBIN UA: NEGATIVE
GLUCOSE UA: NEGATIVE
KETONES UA: NEGATIVE
LEUKOCYTES UA: NEGATIVE
NITRITE UA: NEGATIVE
Protein, UA: NEGATIVE
Spec Grav, UA: 1.01 (ref 1.010–1.025)
Urobilinogen, UA: 0.2 E.U./dL
pH, UA: 7.5 (ref 5.0–8.0)

## 2017-02-24 NOTE — Progress Notes (Signed)
ROB: Patient notes swelling in hands and feet, also noting tingling in hands. Discussed carpal tunnel in pregnancy, advised on hand brace. Will scheduled C-section for 04/16/2017 (pt prefers this date as she does not have to worry about child care).  RTC in 2 weeks.

## 2017-02-24 NOTE — Progress Notes (Signed)
ROB- Pt has noticed her hands and feet has started to swell and has some numbness in hand, other than that she has been ok

## 2017-03-05 ENCOUNTER — Encounter: Payer: Self-pay | Admitting: Obstetrics and Gynecology

## 2017-03-06 ENCOUNTER — Other Ambulatory Visit: Payer: Self-pay

## 2017-03-06 ENCOUNTER — Inpatient Hospital Stay: Payer: Medicaid Other | Admitting: Anesthesiology

## 2017-03-06 ENCOUNTER — Encounter: Payer: Self-pay | Admitting: Emergency Medicine

## 2017-03-06 ENCOUNTER — Inpatient Hospital Stay
Admission: EM | Admit: 2017-03-06 | Discharge: 2017-03-10 | DRG: 785 | Disposition: A | Discharge status: Home / Self Care | Payer: Medicaid Other | Attending: Obstetrics and Gynecology | Admitting: Obstetrics and Gynecology

## 2017-03-06 ENCOUNTER — Encounter
Admission: EM | Disposition: A | Discharge status: Home / Self Care | Payer: Self-pay | Source: Home / Self Care | Attending: Obstetrics and Gynecology

## 2017-03-06 DIAGNOSIS — Z3A33 33 weeks gestation of pregnancy: Secondary | ICD-10-CM

## 2017-03-06 DIAGNOSIS — O1414 Severe pre-eclampsia complicating childbirth: Principal | ICD-10-CM | POA: Diagnosis present

## 2017-03-06 DIAGNOSIS — O9089 Other complications of the puerperium, not elsewhere classified: Secondary | ICD-10-CM | POA: Diagnosis not present

## 2017-03-06 DIAGNOSIS — M25519 Pain in unspecified shoulder: Secondary | ICD-10-CM | POA: Diagnosis not present

## 2017-03-06 DIAGNOSIS — Z91041 Radiographic dye allergy status: Secondary | ICD-10-CM | POA: Diagnosis not present

## 2017-03-06 DIAGNOSIS — O139 Gestational [pregnancy-induced] hypertension without significant proteinuria, unspecified trimester: Secondary | ICD-10-CM | POA: Diagnosis present

## 2017-03-06 DIAGNOSIS — O1493 Unspecified pre-eclampsia, third trimester: Secondary | ICD-10-CM | POA: Diagnosis present

## 2017-03-06 DIAGNOSIS — O1490 Unspecified pre-eclampsia, unspecified trimester: Secondary | ICD-10-CM | POA: Diagnosis present

## 2017-03-06 DIAGNOSIS — R079 Chest pain, unspecified: Secondary | ICD-10-CM | POA: Diagnosis not present

## 2017-03-06 DIAGNOSIS — K219 Gastro-esophageal reflux disease without esophagitis: Secondary | ICD-10-CM | POA: Diagnosis present

## 2017-03-06 DIAGNOSIS — O9962 Diseases of the digestive system complicating childbirth: Secondary | ICD-10-CM | POA: Diagnosis present

## 2017-03-06 DIAGNOSIS — Z302 Encounter for sterilization: Secondary | ICD-10-CM | POA: Diagnosis not present

## 2017-03-06 DIAGNOSIS — O34211 Maternal care for low transverse scar from previous cesarean delivery: Secondary | ICD-10-CM | POA: Diagnosis present

## 2017-03-06 LAB — URINALYSIS, COMPLETE (UACMP) WITH MICROSCOPIC
BILIRUBIN URINE: NEGATIVE
Glucose, UA: NEGATIVE mg/dL
Ketones, ur: NEGATIVE mg/dL
Leukocytes, UA: NEGATIVE
Nitrite: NEGATIVE
PH: 6 (ref 5.0–8.0)
Protein, ur: 100 mg/dL — AB
SPECIFIC GRAVITY, URINE: 1.014 (ref 1.005–1.030)

## 2017-03-06 LAB — HEPATIC FUNCTION PANEL
ALK PHOS: 145 U/L — AB (ref 38–126)
ALT: 57 U/L — AB (ref 14–54)
AST: 90 U/L — AB (ref 15–41)
Albumin: 2.6 g/dL — ABNORMAL LOW (ref 3.5–5.0)
BILIRUBIN TOTAL: 0.6 mg/dL (ref 0.3–1.2)
Bilirubin, Direct: <0.1 mg/dL — ABNORMAL LOW (ref 0.1–0.5)
Total Protein: 6.2 g/dL — ABNORMAL LOW (ref 6.5–8.1)

## 2017-03-06 LAB — COMPREHENSIVE METABOLIC PANEL
ALT: 40 U/L (ref 14–54)
ANION GAP: 7 (ref 5–15)
AST: 61 U/L — ABNORMAL HIGH (ref 15–41)
Albumin: 2.5 g/dL — ABNORMAL LOW (ref 3.5–5.0)
Alkaline Phosphatase: 135 U/L — ABNORMAL HIGH (ref 38–126)
BUN: 10 mg/dL (ref 6–20)
CALCIUM: 8.8 mg/dL — AB (ref 8.9–10.3)
CHLORIDE: 110 mmol/L (ref 101–111)
CO2: 18 mmol/L — AB (ref 22–32)
Creatinine, Ser: 1.01 mg/dL — ABNORMAL HIGH (ref 0.44–1.00)
GFR calc non Af Amer: >60 mL/min (ref >60)
Glucose, Bld: 85 mg/dL (ref 65–99)
POTASSIUM: 3.9 mmol/L (ref 3.5–5.1)
SODIUM: 135 mmol/L (ref 135–145)
Total Bilirubin: 0.5 mg/dL (ref 0.3–1.2)
Total Protein: 6.1 g/dL — ABNORMAL LOW (ref 6.5–8.1)

## 2017-03-06 LAB — CBC WITH DIFFERENTIAL/PLATELET
BASOS ABS: 0 10*3/uL (ref 0–0.1)
BASOS PCT: 0 %
Eosinophils Absolute: 0 10*3/uL (ref 0–0.7)
Eosinophils Relative: 0 %
HEMATOCRIT: 30.6 % — AB (ref 35.0–47.0)
HEMOGLOBIN: 10.5 g/dL — AB (ref 12.0–16.0)
LYMPHS PCT: 16 %
Lymphs Abs: 1.5 10*3/uL (ref 1.0–3.6)
MCH: 29 pg (ref 26.0–34.0)
MCHC: 34.2 g/dL (ref 32.0–36.0)
MCV: 84.8 fL (ref 80.0–100.0)
MONOS PCT: 7 %
Monocytes Absolute: 0.7 10*3/uL (ref 0.2–0.9)
NEUTROS ABS: 7.3 10*3/uL — AB (ref 1.4–6.5)
NEUTROS PCT: 77 %
Platelets: 316 10*3/uL (ref 150–440)
RBC: 3.61 MIL/uL — ABNORMAL LOW (ref 3.80–5.20)
RDW: 12.3 % (ref 11.5–14.5)
WBC: 9.5 10*3/uL (ref 3.6–11.0)

## 2017-03-06 LAB — CBC
HCT: 29.2 % — ABNORMAL LOW (ref 35.0–47.0)
Hemoglobin: 10.1 g/dL — ABNORMAL LOW (ref 12.0–16.0)
MCH: 29.1 pg (ref 26.0–34.0)
MCHC: 34.5 g/dL (ref 32.0–36.0)
MCV: 84.5 fL (ref 80.0–100.0)
PLATELETS: 280 10*3/uL (ref 150–440)
RBC: 3.46 MIL/uL — AB (ref 3.80–5.20)
RDW: 12.1 % (ref 11.5–14.5)
WBC: 7.4 10*3/uL (ref 3.6–11.0)

## 2017-03-06 LAB — RAPID HIV SCREEN (HIV 1/2 AB+AG)
HIV 1/2 ANTIBODIES: NONREACTIVE
HIV-1 P24 Antigen - HIV24: NONREACTIVE

## 2017-03-06 LAB — TYPE AND SCREEN
ABO/RH(D): A POS
ANTIBODY SCREEN: NEGATIVE

## 2017-03-06 LAB — PROTEIN / CREATININE RATIO, URINE
CREATININE, URINE: 51 mg/dL
PROTEIN CREATININE RATIO: 1.31 mg/mg{creat} — AB (ref 0.00–0.15)
TOTAL PROTEIN, URINE: 67 mg/dL

## 2017-03-06 LAB — LIPASE, BLOOD: LIPASE: 24 U/L (ref 11–51)

## 2017-03-06 LAB — HCG, QUANTITATIVE, PREGNANCY: hCG, Beta Chain, Quant, S: 61152 m[IU]/mL — ABNORMAL HIGH (ref <5)

## 2017-03-06 SURGERY — Surgical Case
Anesthesia: Spinal | Site: Abdomen | Wound class: Clean Contaminated

## 2017-03-06 MED ORDER — LABETALOL HCL 5 MG/ML IV SOLN
INTRAVENOUS | Status: AC
  Filled 2017-03-06: qty 4

## 2017-03-06 MED ORDER — KETAMINE HCL 50 MG/ML IJ SOLN
INTRAMUSCULAR | Status: AC
  Filled 2017-03-06: qty 10

## 2017-03-06 MED ORDER — HYDRALAZINE HCL 20 MG/ML IJ SOLN
INTRAMUSCULAR | Status: AC
  Administered 2017-03-08: 5 mg via INTRAVENOUS
  Filled 2017-03-06: qty 1

## 2017-03-06 MED ORDER — LACTATED RINGERS IV SOLN
500.0000 mL | INTRAVENOUS | Status: DC | PRN
  Administered 2017-03-06: 500 mL via INTRAVENOUS

## 2017-03-06 MED ORDER — SOD CITRATE-CITRIC ACID 500-334 MG/5ML PO SOLN
30.0000 mL | ORAL | Status: AC
  Administered 2017-03-06: 30 mL via ORAL

## 2017-03-06 MED ORDER — LABETALOL HCL 5 MG/ML IV SOLN
20.0000 mg | INTRAVENOUS | Status: AC | PRN
  Administered 2017-03-06: 80 mg via INTRAVENOUS
  Administered 2017-03-08: 40 mg via INTRAVENOUS
  Administered 2017-03-08: 20 mg via INTRAVENOUS
  Filled 2017-03-06: qty 4

## 2017-03-06 MED ORDER — OXYCODONE-ACETAMINOPHEN 5-325 MG PO TABS: 1.0000 | ORAL_TABLET | ORAL | Status: DC | PRN

## 2017-03-06 MED ORDER — DIPHENHYDRAMINE HCL 25 MG PO CAPS
25.0000 mg | ORAL_CAPSULE | ORAL | Status: DC | PRN
  Administered 2017-03-07: 25 mg via ORAL
  Filled 2017-03-06 (×2): qty 1

## 2017-03-06 MED ORDER — ONDANSETRON HCL 4 MG/2ML IJ SOLN: 4.0000 mg | Freq: Once | INTRAMUSCULAR | Status: DC | PRN

## 2017-03-06 MED ORDER — OXYTOCIN 40 UNITS IN LACTATED RINGERS INFUSION - SIMPLE MED
INTRAVENOUS | Status: AC
  Filled 2017-03-06: qty 1000

## 2017-03-06 MED ORDER — ZOLPIDEM TARTRATE 5 MG PO TABS: 5.0000 mg | ORAL_TABLET | Freq: Every evening | ORAL | Status: DC | PRN

## 2017-03-06 MED ORDER — KETOROLAC TROMETHAMINE 30 MG/ML IJ SOLN
30.0000 mg | Freq: Four times a day (QID) | INTRAMUSCULAR | Status: AC | PRN
  Administered 2017-03-06 – 2017-03-07 (×2): 30 mg via INTRAVENOUS

## 2017-03-06 MED ORDER — LACTATED RINGERS IV SOLN
INTRAVENOUS | Status: DC
  Administered 2017-03-07 (×2): via INTRAVENOUS

## 2017-03-06 MED ORDER — NALOXONE HCL 2 MG/2ML IJ SOSY
1.0000 ug/kg/h | PREFILLED_SYRINGE | INTRAMUSCULAR | Status: DC | PRN
  Filled 2017-03-06: qty 2

## 2017-03-06 MED ORDER — MIDAZOLAM HCL 2 MG/2ML IJ SOLN
INTRAMUSCULAR | Status: DC | PRN
  Administered 2017-03-06: 2 mg via INTRAVENOUS

## 2017-03-06 MED ORDER — MENTHOL 3 MG MT LOZG
1.0000 | LOZENGE | OROMUCOSAL | Status: DC | PRN
  Filled 2017-03-06: qty 9

## 2017-03-06 MED ORDER — ACETAMINOPHEN 325 MG PO TABS: 650.0000 mg | ORAL_TABLET | ORAL | Status: DC | PRN

## 2017-03-06 MED ORDER — ACETAMINOPHEN 325 MG PO TABS
650.0000 mg | ORAL_TABLET | ORAL | Status: DC | PRN
Start: 2017-03-06 — End: 2017-03-06

## 2017-03-06 MED ORDER — BUPIVACAINE IN DEXTROSE 0.75-8.25 % IT SOLN
INTRATHECAL | Status: DC | PRN
  Administered 2017-03-06: 1.6 mL via INTRATHECAL

## 2017-03-06 MED ORDER — NALOXONE HCL 0.4 MG/ML IJ SOLN: 0.4000 mg | INTRAMUSCULAR | Status: DC | PRN

## 2017-03-06 MED ORDER — MORPHINE SULFATE (PF) 0.5 MG/ML IJ SOLN
INTRAMUSCULAR | Status: DC | PRN
  Administered 2017-03-06: 1 mg via INTRAVENOUS
  Administered 2017-03-06: .1 mg via INTRATHECAL

## 2017-03-06 MED ORDER — MEPERIDINE HCL 50 MG/ML IJ SOLN: 6.2500 mg | INTRAMUSCULAR | Status: DC | PRN

## 2017-03-06 MED ORDER — KETOROLAC TROMETHAMINE 30 MG/ML IJ SOLN
30.0000 mg | Freq: Four times a day (QID) | INTRAMUSCULAR | Status: AC | PRN
  Filled 2017-03-06 (×2): qty 1

## 2017-03-06 MED ORDER — MAGNESIUM SULFATE BOLUS VIA INFUSION
6.0000 g | Freq: Once | INTRAVENOUS | Status: AC
  Administered 2017-03-06: 6 g via INTRAVENOUS
  Filled 2017-03-06: qty 500

## 2017-03-06 MED ORDER — MAGNESIUM SULFATE 40 G IN LACTATED RINGERS - SIMPLE
1.0000 g/h | INTRAVENOUS | Status: DC
  Administered 2017-03-06: 1 g/h via INTRAVENOUS
  Filled 2017-03-06: qty 500

## 2017-03-06 MED ORDER — LIDOCAINE 5 % EX PTCH
MEDICATED_PATCH | CUTANEOUS | Status: DC | PRN
  Administered 2017-03-06: 1 via TRANSDERMAL

## 2017-03-06 MED ORDER — PRENATAL MULTIVITAMIN CH
1.0000 | ORAL_TABLET | Freq: Every day | ORAL | Status: DC
  Administered 2017-03-07 – 2017-03-10 (×4): 1 via ORAL
  Filled 2017-03-06 (×4): qty 1

## 2017-03-06 MED ORDER — SOD CITRATE-CITRIC ACID 500-334 MG/5ML PO SOLN
30.0000 mL | ORAL | Status: DC | PRN
  Filled 2017-03-06: qty 15

## 2017-03-06 MED ORDER — LABETALOL HCL 5 MG/ML IV SOLN
20.0000 mg | INTRAVENOUS | Status: AC | PRN
  Administered 2017-03-06: 40 mg via INTRAVENOUS
  Administered 2017-03-06 (×2): 20 mg via INTRAVENOUS
  Filled 2017-03-06: qty 8

## 2017-03-06 MED ORDER — NALBUPHINE HCL 10 MG/ML IJ SOLN
5.0000 mg | Freq: Once | INTRAMUSCULAR | Status: AC | PRN
  Administered 2017-03-07: 5 mg via INTRAVENOUS

## 2017-03-06 MED ORDER — BUPIVACAINE IN DEXTROSE 0.75-8.25 % IT SOLN
INTRATHECAL | Status: AC
  Filled 2017-03-06: qty 2

## 2017-03-06 MED ORDER — CEFAZOLIN SODIUM-DEXTROSE 2-4 GM/100ML-% IV SOLN
2.0000 g | INTRAVENOUS | Status: DC
  Filled 2017-03-06 (×2): qty 100

## 2017-03-06 MED ORDER — LIDOCAINE 5 % EX PTCH
MEDICATED_PATCH | CUTANEOUS | Status: AC
  Filled 2017-03-06: qty 1

## 2017-03-06 MED ORDER — OXYTOCIN 40 UNITS IN LACTATED RINGERS INFUSION - SIMPLE MED: 2.5000 [IU]/h | INTRAVENOUS | Status: AC

## 2017-03-06 MED ORDER — KETAMINE HCL 50 MG/ML IJ SOLN
INTRAMUSCULAR | Status: DC | PRN
  Administered 2017-03-06: 25 mg via INTRAVENOUS

## 2017-03-06 MED ORDER — NALBUPHINE HCL 10 MG/ML IJ SOLN: 5.0000 mg | INTRAMUSCULAR | Status: DC | PRN

## 2017-03-06 MED ORDER — ONDANSETRON HCL 4 MG/2ML IJ SOLN
INTRAMUSCULAR | Status: AC
  Filled 2017-03-06: qty 2

## 2017-03-06 MED ORDER — MORPHINE SULFATE (PF) 0.5 MG/ML IJ SOLN
INTRAMUSCULAR | Status: AC
  Filled 2017-03-06: qty 10

## 2017-03-06 MED ORDER — FAMOTIDINE 20 MG PO TABS
20.0000 mg | ORAL_TABLET | Freq: Two times a day (BID) | ORAL | Status: DC
  Administered 2017-03-07 – 2017-03-10 (×7): 20 mg via ORAL
  Filled 2017-03-06 (×7): qty 1

## 2017-03-06 MED ORDER — LABETALOL HCL 5 MG/ML IV SOLN
20.0000 mg | INTRAVENOUS | Status: DC | PRN
  Filled 2017-03-06 (×2): qty 8

## 2017-03-06 MED ORDER — FENTANYL CITRATE (PF) 100 MCG/2ML IJ SOLN: 25.0000 ug | INTRAMUSCULAR | Status: DC | PRN

## 2017-03-06 MED ORDER — OXYTOCIN BOLUS FROM INFUSION: 500.0000 mL | Freq: Once | INTRAVENOUS | Status: DC

## 2017-03-06 MED ORDER — MIDAZOLAM HCL 2 MG/2ML IJ SOLN
INTRAMUSCULAR | Status: AC
Start: 2017-03-06 — End: 2017-03-06
  Filled 2017-03-06: qty 2

## 2017-03-06 MED ORDER — SENNOSIDES-DOCUSATE SODIUM 8.6-50 MG PO TABS
2.0000 | ORAL_TABLET | ORAL | Status: DC
  Administered 2017-03-08 – 2017-03-10 (×3): 2 via ORAL
  Filled 2017-03-06 (×6): qty 2

## 2017-03-06 MED ORDER — HYDRALAZINE HCL 20 MG/ML IJ SOLN
5.0000 mg | INTRAMUSCULAR | Status: AC | PRN
  Administered 2017-03-06 – 2017-03-08 (×2): 5 mg via INTRAVENOUS
  Filled 2017-03-06: qty 1

## 2017-03-06 MED ORDER — BETAMETHASONE SOD PHOS & ACET 6 (3-3) MG/ML IJ SUSP
12.0000 mg | INTRAMUSCULAR | Status: DC
  Administered 2017-03-06: 12 mg via INTRAMUSCULAR

## 2017-03-06 MED ORDER — DEXTROSE 5 % IV SOLN
INTRAVENOUS | Status: AC
  Administered 2017-03-06: 100 mL via INTRAVENOUS
  Filled 2017-03-06: qty 20

## 2017-03-06 MED ORDER — HYDRALAZINE HCL 20 MG/ML IJ SOLN: 10.0000 mg | Freq: Once | INTRAMUSCULAR | Status: DC | PRN

## 2017-03-06 MED ORDER — IBUPROFEN 600 MG PO TABS: 600.0000 mg | ORAL_TABLET | Freq: Four times a day (QID) | ORAL | Status: DC | PRN

## 2017-03-06 MED ORDER — LABETALOL HCL 5 MG/ML IV SOLN
INTRAVENOUS | Status: AC
  Filled 2017-03-06: qty 12

## 2017-03-06 MED ORDER — DIPHENHYDRAMINE HCL 50 MG/ML IJ SOLN
12.5000 mg | INTRAMUSCULAR | Status: DC | PRN
  Administered 2017-03-07 (×2): 12.5 mg via INTRAVENOUS
  Filled 2017-03-06 (×2): qty 1

## 2017-03-06 MED ORDER — OXYTOCIN 40 UNITS IN LACTATED RINGERS INFUSION - SIMPLE MED
2.5000 [IU]/h | INTRAVENOUS | Status: DC
  Administered 2017-03-06: 500 mL via INTRAVENOUS

## 2017-03-06 MED ORDER — ACETAMINOPHEN 500 MG PO TABS: 1000.0000 mg | ORAL_TABLET | Freq: Four times a day (QID) | ORAL | Status: DC

## 2017-03-06 MED ORDER — IBUPROFEN 600 MG PO TABS
600.0000 mg | ORAL_TABLET | Freq: Four times a day (QID) | ORAL | Status: DC
  Administered 2017-03-07 – 2017-03-10 (×12): 600 mg via ORAL
  Filled 2017-03-06 (×13): qty 1

## 2017-03-06 MED ORDER — LACTATED RINGERS IV SOLN
INTRAVENOUS | Status: DC
  Administered 2017-03-08: 03:00:00 via INTRAVENOUS

## 2017-03-06 MED ORDER — SIMETHICONE 80 MG PO CHEW
80.0000 mg | CHEWABLE_TABLET | Freq: Four times a day (QID) | ORAL | Status: DC
  Administered 2017-03-07 – 2017-03-10 (×11): 80 mg via ORAL
  Filled 2017-03-06 (×17): qty 1

## 2017-03-06 MED ORDER — SODIUM CHLORIDE 0.9% FLUSH
3.0000 mL | INTRAVENOUS | Status: DC | PRN
  Administered 2017-03-08: 3 mL via INTRAVENOUS
  Filled 2017-03-06: qty 3

## 2017-03-06 MED ORDER — ONDANSETRON HCL 4 MG/2ML IJ SOLN
INTRAMUSCULAR | Status: DC | PRN
  Administered 2017-03-06: 4 mg via INTRAVENOUS

## 2017-03-06 MED ORDER — SODIUM CHLORIDE 0.9 % IV SOLN: 2.0000 g | INTRAVENOUS | Status: DC

## 2017-03-06 MED ORDER — NALBUPHINE HCL 10 MG/ML IJ SOLN
5.0000 mg | INTRAMUSCULAR | Status: DC | PRN
  Administered 2017-03-07 – 2017-03-08 (×3): 5 mg via INTRAVENOUS
  Filled 2017-03-06 (×4): qty 1

## 2017-03-06 MED ORDER — CALCIUM GLUCONATE 10 % IV SOLN
INTRAVENOUS | Status: AC
  Filled 2017-03-06: qty 10

## 2017-03-06 MED ORDER — OXYCODONE-ACETAMINOPHEN 5-325 MG PO TABS
2.0000 | ORAL_TABLET | ORAL | Status: DC | PRN
  Administered 2017-03-07: 2 via ORAL
  Filled 2017-03-06: qty 2

## 2017-03-06 MED ORDER — CEFAZOLIN SODIUM-DEXTROSE 1-4 GM/50ML-% IV SOLN
INTRAVENOUS | Status: AC
  Filled 2017-03-06: qty 100

## 2017-03-06 MED ORDER — MAGNESIUM SULFATE 40 G IN LACTATED RINGERS - SIMPLE
2.0000 g/h | INTRAVENOUS | Status: DC
  Administered 2017-03-06 – 2017-03-08 (×3): 2 g/h via INTRAVENOUS
  Filled 2017-03-06 (×3): qty 500

## 2017-03-06 MED ORDER — NALBUPHINE HCL 10 MG/ML IJ SOLN: 5.0000 mg | Freq: Once | INTRAMUSCULAR | Status: AC | PRN

## 2017-03-06 SURGICAL SUPPLY — 30 items
ADHESIVE MASTISOL STRL (MISCELLANEOUS) ×4 IMPLANT
BAG COUNTER SPONGE EZ (MISCELLANEOUS) ×6 IMPLANT
CANISTER SUCT 3000ML PPV (MISCELLANEOUS) ×4 IMPLANT
CELL SAVER LIPIGURD (MISCELLANEOUS) IMPLANT
CHLORAPREP W/TINT 26ML (MISCELLANEOUS) ×8 IMPLANT
CLIP FILSHIE TUBAL LIGA STRL (Clip) ×4 IMPLANT
CLOSURE WOUND 1/2 X4 (GAUZE/BANDAGES/DRESSINGS) ×1
COUNTER SPONGE BAG EZ (MISCELLANEOUS) ×2
DRSG TELFA 3X8 NADH (GAUZE/BANDAGES/DRESSINGS) ×4 IMPLANT
EXTRT SYSTEM ALEXIS 14CM (MISCELLANEOUS)
GAUZE SPONGE 4X4 12PLY STRL (GAUZE/BANDAGES/DRESSINGS) ×4 IMPLANT
GLOVE INDICATOR 7.0 STRL GRN (GLOVE) ×16 IMPLANT
GLOVE ORTHO TXT STRL SZ7.5 (GLOVE) ×4 IMPLANT
GLOVE PROTEXIS LATEX SZ 7.5 (GLOVE) ×20 IMPLANT
GLOVE SURG SYN 6.5 ES PF (GLOVE) ×4 IMPLANT
GOWN STRL REUS W/ TWL LRG LVL3 (GOWN DISPOSABLE) ×6 IMPLANT
GOWN STRL REUS W/TWL LRG LVL3 (GOWN DISPOSABLE) ×6
KIT RM TURNOVER STRD PROC AR (KITS) ×4 IMPLANT
NS IRRIG 1000ML POUR BTL (IV SOLUTION) ×4 IMPLANT
PACK C SECTION AR (MISCELLANEOUS) ×4 IMPLANT
PAD OB MATERNITY 4.3X12.25 (PERSONAL CARE ITEMS) ×4 IMPLANT
PAD PREP 24X41 OB/GYN DISP (PERSONAL CARE ITEMS) ×4 IMPLANT
RTRCTR C-SECT PINK 25CM LRG (MISCELLANEOUS) ×4 IMPLANT
SPONGE LAP 18X18 5 PK (GAUZE/BANDAGES/DRESSINGS) IMPLANT
STRIP CLOSURE SKIN 1/2X4 (GAUZE/BANDAGES/DRESSINGS) ×3 IMPLANT
SUT VIC AB 0 CTX 36 (SUTURE) ×4
SUT VIC AB 0 CTX36XBRD ANBCTRL (SUTURE) ×4 IMPLANT
SUT VIC AB 1 CT1 36 (SUTURE) ×8 IMPLANT
SUT VICRYL+ 3-0 36IN CT-1 (SUTURE) ×8 IMPLANT
SWABSTK COMLB BENZOIN TINCTURE (MISCELLANEOUS) ×4 IMPLANT

## 2017-03-06 NOTE — OB Triage Note (Signed)
[id number] [facility]. Pt is on magnesium drip 1gm/hr. Pt came in to be evaluated due to lower abdominal pain and pelvic pressure as well as intermittent pain in her sub-sternal area and SHOB while walking that began around  two weeks ago. Pt also complained of swelling in her ankles that began at that time. BP 168/88 on arrival, cycling Q15. Denies headache, blurry vision, epigastric pain at this time. 3+ reflexes, no clonus. P/Cr [facility]. Pt says she has been feeling baby move, but less today. She denies bleeding, LOF and unusual discharge. Monitors applied and assessing.

## 2017-03-06 NOTE — Anesthesia Procedure Notes (Signed)
Date/Time: 03/06/2017 8:43 PM Performed by: Stormy Fabianurtis, Shirlyn Savin, CRNA Pre-anesthesia Checklist: Patient identified, Emergency Drugs available, Suction available and Patient being monitored Patient Re-evaluated:Patient Re-evaluated prior to induction Oxygen Delivery Method: Nasal cannula Induction Type: IV induction Dental Injury: Teeth and Oropharynx as per pre-operative assessment  Comments: Nasal cannula with etCO2 monitoring

## 2017-03-06 NOTE — ED Notes (Signed)
Pt reports she had a moment of feeling faint while bolus was running. Pt had red blotches noted to chest but denies SOB. Pt in NAD. Dr. Logan BoresEvans reported to RN to keep pt NPO at this time. [facility] [place]

## 2017-03-06 NOTE — ED Provider Notes (Signed)
[facility] Emergency Department Provider Note  ____________________________________________   First MD Initiated Contact with Patient 03/06/17 1257     (approximate)  I have reviewed the triage vital signs and the nursing notes.   HISTORY  Chief Complaint Abdominal Pain and Diarrhea   HPI Angela Andrews is a 36 y.o. female who is a G4 at [redacted] weeks gestation was presented to the emergency department today with lower abdominal pressure as well as bilateral swelling to the ankles and wrists over the past 2 weeks.  She says that she is also had upper abdominal pain intermittently as well as loose stools.  Says that she has not had a history of preeclampsia and that her blood pressure usually runs at about 110 systolic.  [facility] at [facility]. Denies any vaginal fluid leakage nor blood from the vagina.  Past Medical History:  Diagnosis Date  . Anxiety   . Blood in stool 07/2009  . Depression   . FHx: migraine headaches   . Vaginal itching 07/2009    Patient Active Problem List   Diagnosis Date Noted  . Supervision of normal intrauterine pregnancy in multigravida in second trimester 10/10/2016  . AMA (advanced maternal age) multigravida 35+, second trimester 10/10/2016  . H/O cesarean section complicating pregnancy 10/10/2016    Past Surgical History:  Procedure Laterality Date  . CESAREAN SECTION  2004 , 2005 and 2009   x 3  . TYMPANOSTOMY TUBE PLACEMENT  06/1997, 11/1998, 01/2001  . WISDOM TOOTH EXTRACTION     x4    Prior to Admission medications   Medication Sig Start Date End Date Taking? Authorizing Provider  Prenatal Vit-Fe Fumarate-FA (PRENATAL MULTIVITAMIN) TABS tablet Take 1 tablet by mouth daily at 12 noon.   Yes [provider]    Allergies Iodine  Family History  Problem Relation Age of Onset  . Cancer Maternal Grandfather   . Hearing loss Maternal Grandfather   . Diabetes Father   . Liver disease Father    . Hyperlipidemia Father   . Arthritis Mother   . Hearing loss Mother   . Arthritis Maternal Grandmother   . COPD Paternal Grandmother        fluid in lungs  . Hyperlipidemia Sister   . Diabetes Paternal Aunt   . Hypertension Paternal Aunt   . Hyperlipidemia Paternal Aunt   . Diabetes Paternal Uncle   . Hypertension Paternal Uncle   . Hyperlipidemia Paternal Uncle   . Breast cancer Neg Hx   . Ovarian cancer Neg Hx   . Colon cancer Neg Hx     Social History Social History   Tobacco Use  . Smoking status: Never Smoker  . Smokeless tobacco: Never Used  Substance Use Topics  . Alcohol use: No  . Drug use: No    Review of Systems  Constitutional: No fever/chills Eyes: No visual changes. ENT: No sore throat. Cardiovascular: Denies chest pain. Respiratory: Denies shortness of breath. Gastrointestinal: No nausea, no vomiting.  No constipation. Genitourinary: Negative for dysuria. Musculoskeletal: Negative for back pain. Skin: Negative for rash. Neurological: Negative for headaches, focal weakness or numbness.   ____________________________________________   PHYSICAL EXAM:  VITAL SIGNS: ED Triage Vitals  Enc Vitals Group     BP 03/06/17 1127 (!) 175/93     Pulse Rate 03/06/17 1127 (!) 55     Resp 03/06/17 1127 16     Temp 03/06/17 1127 (!) 97.5 F (36.4 C)  Temp Source 03/06/17 1127 Oral     SpO2 03/06/17 1127 100 %     Weight 03/06/17 1128 181 lb (82.1 kg)     Height 03/06/17 1128 5\' 6"  (1.676 m)     Head Circumference --      Peak Flow --      Pain Score 03/06/17 1127 5     Pain Loc --      Pain Edu? --      Excl. in GC? --     Constitutional: Alert and oriented. Well appearing and in no acute distress. Eyes: Conjunctivae are normal.  Head: Atraumatic. Nose: No congestion/rhinnorhea. Mouth/Throat: Mucous membranes are moist.  Neck: No stridor.   Cardiovascular: Normal rate, regular rhythm. Grossly normal heart sounds.   Respiratory: Normal  respiratory effort.  No retractions. Lungs CTAB. Gastrointestinal: Soft and nontender.  Gravid uterus consistent with dates.  No CVA tenderness. Musculoskeletal: Minimal edema to the bilateral ankles as well as wrists.  No joint effusions. Neurologic:  Normal speech and language. No gross focal neurologic deficits are appreciated. Skin:  Skin is warm, dry and intact. No rash noted. Psychiatric: Mood and affect are normal. Speech and behavior are normal.  ____________________________________________   LABS (all labs ordered are listed, but only abnormal results are displayed)  Labs Reviewed  COMPREHENSIVE METABOLIC PANEL - Abnormal; Notable for the following components:      Result Value   CO2 18 (*)    Creatinine, Ser 1.01 (*)    Calcium 8.8 (*)    Total Protein 6.1 (*)    Albumin 2.5 (*)    AST 61 (*)    Alkaline Phosphatase 135 (*)    All other components within normal limits  CBC - Abnormal; Notable for the following components:   RBC 3.46 (*)    Hemoglobin 10.1 (*)    HCT 29.2 (*)    All other components within normal limits  URINALYSIS, COMPLETE (UACMP) WITH MICROSCOPIC - Abnormal; Notable for the following components:   Color, Urine YELLOW (*)    APPearance CLEAR (*)    Hgb urine dipstick MODERATE (*)    Protein, ur 100 (*)    Bacteria, UA RARE (*)    Squamous Epithelial / LPF 0-5 (*)    All other components within normal limits  HCG, QUANTITATIVE, PREGNANCY - Abnormal; Notable for the following components:   hCG, Beta Chain, Quant, S 61,152 (*)    All other components within normal limits  LIPASE, BLOOD   ____________________________________________  EKG   ____________________________________________  RADIOLOGY   ____________________________________________   PROCEDURES  Procedure(s) performed:   Procedures  Critical Care performed:   CRITICAL CARE Performed by: Arelia LongestSchaevitz,  Ellias Mcelreath M   Total critical care time: 35 minutes  Critical care time  was exclusive of separately billable procedures and treating other patients.  Critical care was necessary to treat or prevent imminent or life-threatening deterioration.  Critical care was time spent personally by me on the following activities: development of treatment plan with patient and/or surrogate as well as nursing, discussions with consultants, evaluation of patient's response to treatment, examination of patient, obtaining history from patient or surrogate, ordering and performing treatments and interventions, ordering and review of laboratory studies, ordering and review of radiographic studies, pulse oximetry and re-evaluation of patient's condition.   ____________________________________________   INITIAL IMPRESSION / ASSESSMENT AND PLAN / ED COURSE  Pertinent labs & imaging results that were available during my care of the patient were reviewed by  me and considered in my medical decision making (see chart for details).  DDX: Preterm labor, preeclampsia, third trimester abdominal pain, diarrhea, peripheral edema  As part of my medical decision making, I reviewed the following data within the electronic MEDICAL RECORD NUMBER Notes from prior ED visits  ----------------------------------------- 1:28 PM on 03/06/2017 -----------------------------------------  Discussed case with Dr. Logan BoresEvans of [facility].  He accepts the patient on his service on the labor and delivery floor for further monitoring.  Patient ordered magnesium here in the emergency department.  She is understanding of the diagnosis as well as the treatment plan willing to comply.        ____________________________________________   FINAL CLINICAL IMPRESSION(S) / ED DIAGNOSES  Preeclampsia    NEW MEDICATIONS STARTED DURING THIS VISIT:  This SmartLink is deprecated. Use AVSMEDLIST instead to display the medication list for a patient.   Note:  This document was prepared using Dragon voice  recognition software and may include unintentional dictation errors.     Myrna BlazerSchaevitz, Dany Walther Matthew, MD 03/06/17 1328

## 2017-03-06 NOTE — ED Notes (Signed)
Patient discussed with Dr. Cinda Quest. Per Dr. Cinda Quest pt needs CBC, Met C, Urine, and Hcg Quant.   Dr. Cinda Quest aware pt is hypertensive. At 175/93 and is [redacted] weeks pregnant.

## 2017-03-06 NOTE — Transfer of Care (Signed)
Immediate Anesthesia Transfer of Care Note  Patient: Angela MassonRachel Steffek  Procedure(s) Performed: Procedure(s): CESAREAN SECTION WITH BILATERAL TUBAL LIGATION (N/A)  Patient Location: PACU  Anesthesia Type:Spinal  Level of Consciousness: awake, alert  and oriented  Airway & Oxygen Therapy: Patient Spontanous Breathing and Patient connected to face mask oxygen  Post-op Assessment: Report given to RN and Post -op Vital signs reviewed and stable  Post vital signs: Reviewed and stable  Last Vitals:  Vitals:   03/06/17 1920 03/06/17 1923  BP: (!) 153/83 (!) 153/83  Pulse: (!) 101 (!) 101  Resp: 20   Temp: 36.5 C   SpO2:      Complications: No apparent anesthesia complications

## 2017-03-06 NOTE — Anesthesia Preprocedure Evaluation (Signed)
Anesthesia Evaluation  Patient identified by MRN, date of birth, ID band Patient awake    Reviewed: Allergy & Precautions, NPO status , Patient's Chart, lab work & pertinent test results  History of Anesthesia Complications Negative for: history of anesthetic complications  Airway Mallampati: III       Dental   Pulmonary neg sleep apnea, neg COPD,           Cardiovascular hypertension, (-) Past MI and (-) CHF (-) dysrhythmias (-) Valvular Problems/Murmurs     Neuro/Psych neg Seizures Anxiety Depression    GI/Hepatic Neg liver ROS, GERD  Medicated,  Endo/Other  neg diabetes  Renal/GU negative Renal ROS     Musculoskeletal   Abdominal   Peds  Hematology   Anesthesia Other Findings   Reproductive/Obstetrics (+) Pregnancy                             Anesthesia Physical Anesthesia Plan  ASA: II and emergent  Anesthesia Plan: Spinal   Post-op Pain Management:    Induction:   PONV Risk Score and Plan:   Airway Management Planned:   Additional Equipment:   Intra-op Plan:   Post-operative Plan:   Informed Consent: I have reviewed the patients History and Physical, chart, labs and discussed the procedure including the risks, benefits and alternatives for the proposed anesthesia with the patient or authorized representative who has indicated his/her understanding and acceptance.     Plan Discussed with:   Anesthesia Plan Comments:         Anesthesia Quick Evaluation

## 2017-03-06 NOTE — ED Notes (Signed)
RN paged Dr. Logan BoresEvans to check if pt can eat. PT reports " I am starving and continues to ask if she can eat or drink something."

## 2017-03-06 NOTE — Anesthesia Post-op Follow-up Note (Signed)
Anesthesia QCDR form completed.        

## 2017-03-06 NOTE — Op Note (Signed)
      OP NOTE  Date: 03/06/2017   10:09 PM Name Angela MassonRachel Andrews MR# [id number]  Preoperative Diagnosis: 1. Intrauterine pregnancy at 6941w5d 2. Desires Permanent Sterilization 3. PIH with severe features Active Problems:   Preeclampsia   PIH (pregnancy induced hypertension)  Postoperative Diagnosis: 1. Intrauterine pregnancy at 5141w5d, delivered 2. Desires Permanent Sterilization 3. Viable infant 4. Remainder same as pre-op  Procedure: 1. Repeat Low-Transverse Cesarean Section 2. Bilateral Tubal Occlusion  Surgeon: Elonda Huskyavid J. Sakiya Stepka, MD  Anesthesia: Spinal   EBL: 350  ml    Findings: 1) female infant, Apgar scores of 6    at 1 minute and 9    at 5 minutes and a birthweight of 70.9  ounces.    2) Normal uterus, tubes and ovaries.   Procedure:   The patient was prepped and draped in the supine position and placed under spinal anesthesia.  A transverse incision was made across the abdomen in a Pfannenstiel manner. If indicated the old scar was systematically removed with sharp dissection.  We carried the dissection down to the level of the fascia.  The fascia was incised in a curvilinear manner.  The fascia was then elevated from the rectus muscles with blunt and sharp dissection.  The rectus muscles were separated laterally exposing the peritoneum.  The peritoneum was carefully entered with care being taken to avoid bowel and bladder.  A self-retaining retractor was placed.  The visceral peritoneum was incised in a curvilinear fashion across the lower uterine segment creating a bladder flap. A transverse incision was made across the lower uterine segment and extended laterally and superiorly using the bandage scissors.  Artificial rupture membranes was performed and Clear fluid was noted.  The infant was delivered from the cephalic position.  A nuchal cord was not present. The cord was doubly clamped and cut. Cord blood was obtained if appropriate.  The infant was handed to the pediatric  personnel  who then placed the infant under heat lamps where it was cleaned dried and re-suctioned. The placenta was delivered. The hysterotomy incision was then identified on ring forceps.  The uterine cavity was cleaned with a moist lap sponge.  The hysterotomy incision was closed with a running interlocking suture of Vicryl.  Hemostasis was excellent.  Pitocin was run in the IV and the uterus was found to be firm. The fallopian tubes were identified  and followed out to their fine fimbriated ends and then back to the mid-portion of the tubeon which was elevated on a babcock clamp.  Both tubes were completely occluded using Filshie clips in a perpendicular manner.  Hemostasis was noted. The posterior cul-de-sac and gutters were cleaned and inspected.  Hemostasis was noted.  The fascia was then closed with a running suture of #1 Vicryl.  Hemostasis of the subcutaneous tissues was obtained using the Bovie.  The subcutaneous tissues were closed with a running suture of 000 Vicryl.  A subcuticular suture was placed.  Steri-Strips were applied in the usual manner.  A pressure dressing was placed.  The patient went to the recovery room in stable condition.   Elonda Huskyavid J. Raevon Broom, M.D. 03/06/2017 10:09 PM

## 2017-03-06 NOTE — ED Triage Notes (Signed)
Pt to ED via for abdominal pain and diarrhea since Wednesday. Pt denies vomiting but states that she has had nausea. Pt states that she has also been having shortness of breath that is worse when she lays down. Pt also c/o swelling and itching in her hands and feet. Pt is [redacted] weeks pregnant. Hypertensive in triage without hx/o HTN.

## 2017-03-06 NOTE — ED Notes (Signed)
RN called pharmacy for mag drinp. Pharmacy is mixing medication at this time.

## 2017-03-06 NOTE — Anesthesia Procedure Notes (Signed)
Spinal  Patient location during procedure: OR Start time: 03/06/2017 8:47 PM End time: 03/06/2017 8:50 PM Staffing Resident/CRNA: Doreen Salvage, CRNA Performed: resident/CRNA  Preanesthetic Checklist Completed: patient identified, site marked, surgical consent, pre-op evaluation, timeout performed, IV checked, risks and benefits discussed and monitors and equipment checked Spinal Block Patient position: sitting Prep: Betadine Patient monitoring: heart rate, continuous pulse ox, blood pressure and cardiac monitor Approach: midline Location: L4-5 Injection technique: single-shot Needle Needle type: Introducer and Pencan  Needle gauge: 24 G Needle length: 9 cm Additional Notes Negative paresthesia. Negative blood return. Positive free-flowing CSF. Expiration date of kit checked and confirmed. Patient tolerated procedure well, without complications.

## 2017-03-06 NOTE — H&P (Signed)
History and Physical   HPI  Angela MassonRachel Andrews is a 36 y.o. G4P3003 at 7851w5d Estimated Date of Delivery: 04/19/17 who is being admitted for Pre-eclampsia.  Pt has complaint of epigastric distress as well as occasional contractions.     OB History  Obstetric History   G4   P3   T3   P0   A0   L3    SAB0   TAB0   Ectopic0   Multiple0   Live Births3     # Outcome Date GA Lbr Len/2nd Weight Sex Delivery Anes PTL Lv  4 Current           3 Term 2009   6 lb 1.8 oz (2.771 kg) M CS-LTranv   LIV  2 Term 2005   7 lb 14.4 oz (3.583 kg) M CS-LTranv   LIV  1 Term 2004   6 lb 2.2 oz (2.785 kg) M CS-LTranv   LIV     Complications: Failure to progress in labor      PROBLEM LIST  Pregnancy complications or risks: Patient Active Problem List   Diagnosis Date Noted  . Preeclampsia 03/06/2017  . PIH (pregnancy induced hypertension) 03/06/2017  . Supervision of normal intrauterine pregnancy in multigravida in second trimester 10/10/2016  . AMA (advanced maternal age) multigravida 35+, second trimester 10/10/2016  . H/O cesarean section complicating pregnancy 10/10/2016    Prenatal labs and studies: ABO, Rh: --/--/A POS (11/10 1824) Antibody: NEG (11/10 1824) Rubella: 4.92 (05/24 1632) RPR: Non Reactive (05/24 1632)  HBsAg: Negative (05/24 1632)  HIV:    GBS:    Past Medical History:  Diagnosis Date  . Anxiety   . Blood in stool 07/2009  . Depression   . FHx: migraine headaches   . Vaginal itching 07/2009     Past Surgical History:  Procedure Laterality Date  . CESAREAN SECTION  2004 , 2005 and 2009   x 3  . CESAREAN SECTION    . TYMPANOSTOMY TUBE PLACEMENT  06/1997, 11/1998, 01/2001  . WISDOM TOOTH EXTRACTION     x4     Medications      Medication List    ASK your doctor about these medications   prenatal multivitamin Tabs tablet   ranitidine 150 MG tablet Commonly known as:  ZANTAC        Allergies  Iodine  Review of Systems  Pertinent items noted in  HPI and remainder of comprehensive ROS otherwise negative.  Physical Exam  BP (!) 153/83   Pulse (!) 101   Temp 97.7 F (36.5 C) (Oral)   Resp 20   Ht 5\' 6"  (1.676 m)   Wt 181 lb (82.1 kg)   LMP 07/13/2016   SpO2 99%   BMI 29.21 kg/m   Lungs:  CTA B Cardio: RRR without M/R/G Abd: Soft, gravid, NT Presentation: cephalic EXT: No C/C/ 1+ Edema DTRs: 2+ B CERVIX: not evaluated   See Prenatal records for more detailed PE.     FHR:  Variability: Good {> 6 bpm)  Toco: Uterine Contractions: rare   Test Results  Results for orders placed or performed during the hospital encounter of 03/06/17 (from the past 24 hour(s))  Lipase, blood     Status: None   Collection Time: 03/06/17 11:34 AM  Result Value Ref Range   Lipase 24 11 - 51 U/L  Comprehensive metabolic panel     Status: Abnormal   Collection Time: 03/06/17 11:34 AM  Result Value  Ref Range   Sodium 135 135 - 145 mmol/L   Potassium 3.9 3.5 - 5.1 mmol/L   Chloride 110 101 - 111 mmol/L   CO2 18 (L) 22 - 32 mmol/L   Glucose, Bld 85 65 - 99 mg/dL   BUN 10 6 - 20 mg/dL   Creatinine, Ser 1.611.01 (H) 0.44 - 1.00 mg/dL   Calcium 8.8 (L) 8.9 - 10.3 mg/dL   Total Protein 6.1 (L) 6.5 - 8.1 g/dL   Albumin 2.5 (L) 3.5 - 5.0 g/dL   AST 61 (H) 15 - 41 U/L   ALT 40 14 - 54 U/L   Alkaline Phosphatase 135 (H) 38 - 126 U/L   Total Bilirubin 0.5 0.3 - 1.2 mg/dL   GFR calc non Af Amer >60 >60 mL/min   GFR calc Af Amer >60 >60 mL/min   Anion gap 7 5 - 15  CBC     Status: Abnormal   Collection Time: 03/06/17 11:34 AM  Result Value Ref Range   WBC 7.4 3.6 - 11.0 K/uL   RBC 3.46 (L) 3.80 - 5.20 MIL/uL   Hemoglobin 10.1 (L) 12.0 - 16.0 g/dL   HCT 09.629.2 (L) 04.535.0 - 40.947.0 %   MCV 84.5 80.0 - 100.0 fL   MCH 29.1 26.0 - 34.0 pg   MCHC 34.5 32.0 - 36.0 g/dL   RDW 81.112.1 91.411.5 - 78.214.5 %   Platelets 280 150 - 440 K/uL  Urinalysis, Complete w Microscopic     Status: Abnormal   Collection Time: 03/06/17 11:34 AM  Result Value Ref Range    Color, Urine YELLOW (A) YELLOW   APPearance CLEAR (A) CLEAR   Specific Gravity, Urine 1.014 1.005 - 1.030   pH 6.0 5.0 - 8.0   Glucose, UA NEGATIVE NEGATIVE mg/dL   Hgb urine dipstick MODERATE (A) NEGATIVE   Bilirubin Urine NEGATIVE NEGATIVE   Ketones, ur NEGATIVE NEGATIVE mg/dL   Protein, ur 956100 (A) NEGATIVE mg/dL   Nitrite NEGATIVE NEGATIVE   Leukocytes, UA NEGATIVE NEGATIVE   RBC / HPF 6-30 0 - 5 RBC/hpf   WBC, UA 0-5 0 - 5 WBC/hpf   Bacteria, UA RARE (A) NONE SEEN   Squamous Epithelial / LPF 0-5 (A) NONE SEEN   Mucus PRESENT    Hyaline Casts, UA PRESENT   hCG, quantitative, pregnancy     Status: Abnormal   Collection Time: 03/06/17 11:34 AM  Result Value Ref Range   hCG, Beta Chain, Quant, S 61,152 (H) <5 mIU/mL  Protein / creatinine ratio, urine     Status: Abnormal   Collection Time: 03/06/17  3:03 PM  Result Value Ref Range   Creatinine, Urine 51 mg/dL   Total Protein, Urine 67 mg/dL   Protein Creatinine Ratio 1.31 (H) 0.00 - 0.15 mg/mg[Cre]  Hepatic function panel     Status: Abnormal   Collection Time: 03/06/17  6:24 PM  Result Value Ref Range   Total Protein 6.2 (L) 6.5 - 8.1 g/dL   Albumin 2.6 (L) 3.5 - 5.0 g/dL   AST 90 (H) 15 - 41 U/L   ALT 57 (H) 14 - 54 U/L   Alkaline Phosphatase 145 (H) 38 - 126 U/L   Total Bilirubin 0.6 0.3 - 1.2 mg/dL   Bilirubin, Direct <2.1<0.1 (L) 0.1 - 0.5 mg/dL   Indirect Bilirubin NOT CALCULATED 0.3 - 0.9 mg/dL  Type and screen [facility]     Status: None   Collection Time: 03/06/17  6:24 PM  Result Value Ref Range   ABO/RH(D) A POS    Antibody Screen NEG    Sample Expiration 03/09/2017   CBC with Differential     Status: Abnormal   Collection Time: 03/06/17  6:24 PM  Result Value Ref Range   WBC 9.5 3.6 - 11.0 K/uL   RBC 3.61 (L) 3.80 - 5.20 MIL/uL   Hemoglobin 10.5 (L) 12.0 - 16.0 g/dL   HCT 40.9 (L) 81.1 - 91.4 %   MCV 84.8 80.0 - 100.0 fL   MCH 29.0 26.0 - 34.0 pg   MCHC 34.2 32.0 - 36.0 g/dL   RDW  78.2 95.6 - 21.3 %   Platelets 316 150 - 440 K/uL   Neutrophils Relative % 77 %   Neutro Abs 7.3 (H) 1.4 - 6.5 K/uL   Lymphocytes Relative 16 %   Lymphs Abs 1.5 1.0 - 3.6 K/uL   Monocytes Relative 7 %   Monocytes Absolute 0.7 0.2 - 0.9 K/uL   Eosinophils Relative 0 %   Eosinophils Absolute 0.0 0 - 0.7 K/uL   Basophils Relative 0 %   Basophils Absolute 0.0 0 - 0.1 K/uL     Assessment   G4P3003 at [redacted]w[redacted]d Estimated Date of Delivery: 04/19/17  The fetus is reassuring.  She has had persistently elevated blood pressures requiring medication. She continues to experience epigastric distress. Significant increase in her liver enzymes were noted.  Patient Active Problem List   Diagnosis Date Noted  . Preeclampsia 03/06/2017  . PIH (pregnancy induced hypertension) 03/06/2017  . Supervision of normal intrauterine pregnancy in multigravida in second trimester 10/10/2016  . AMA (advanced maternal age) multigravida 35+, second trimester 10/10/2016  . H/O cesarean section complicating pregnancy 10/10/2016    Plan  1. Admit to L&D :   She was begun on magnesium and will continue on this post op and postpartum.  She received 1 dose of betamethasone with the intention of waiting for 24 hours however her continued issue with blood pressures and increasing liver enzymes make it necessary to proceed towards cesarean delivery. [I spoke in detail with Dr. Leavy Cella a [facility] earlier regarding management of preeclampsia at 9 and 5 days.]  (Patient has had 3 prior cesarean deliveries) She was also considering permanent sterilization. 2. EFM: -- Category 1   Elonda Husky, M.D. 03/06/2017 8:05 PM

## 2017-03-06 NOTE — ED Notes (Signed)
Accepted by Dr. Logan BoresEvans with labor and delivery (on call doctor)

## 2017-03-07 LAB — CBC
HEMATOCRIT: 27.4 % — AB (ref 35.0–47.0)
HEMOGLOBIN: 9.1 g/dL — AB (ref 12.0–16.0)
MCH: 28.7 pg (ref 26.0–34.0)
MCHC: 33.4 g/dL (ref 32.0–36.0)
MCV: 86.2 fL (ref 80.0–100.0)
Platelets: 302 10*3/uL (ref 150–440)
RBC: 3.17 MIL/uL — ABNORMAL LOW (ref 3.80–5.20)
RDW: 12.3 % (ref 11.5–14.5)
WBC: 14.9 10*3/uL — ABNORMAL HIGH (ref 3.6–11.0)

## 2017-03-07 LAB — MAGNESIUM: MAGNESIUM: 6.4 mg/dL — AB (ref 1.7–2.4)

## 2017-03-07 NOTE — Anesthesia Post-op Follow-up Note (Signed)
  Anesthesia Pain Follow-up Note  Patient: Angela MassonRachel Andrews  Day #: 1  Date of Follow-up: 03/07/2017 Time: 1:43 PM  Last Vitals:  Vitals:   03/07/17 1140 03/07/17 1151  BP:    Pulse:    Resp:    Temp:  36.5 C  SpO2: 96%     Level of Consciousness: alert  Pain: 0 /10   Side Effects:Pruritis  Catheter Site Exam:clean, dry     Plan: D/C from anesthesia care at surgeon's request  Jaydence Arnesen K

## 2017-03-07 NOTE — Anesthesia Postprocedure Evaluation (Signed)
Anesthesia Post Note  Patient: Angela MassonRachel Andrews  Procedure(s) Performed: CESAREAN SECTION WITH BILATERAL TUBAL LIGATION (N/A Abdomen)  Patient location during evaluation: L&D Anesthesia Type: Spinal Level of consciousness: oriented and awake and alert Pain management: pain level controlled Vital Signs Assessment: post-procedure vital signs reviewed and stable Respiratory status: spontaneous breathing, respiratory function stable and patient connected to nasal cannula oxygen Cardiovascular status: stable Postop Assessment: no headache, no backache and no apparent nausea or vomiting Anesthetic complications: no     Last Vitals:  Vitals:   03/07/17 1140 03/07/17 1151  BP:    Pulse:    Resp:    Temp:  36.5 C  SpO2: 96%     Last Pain:  Vitals:   03/07/17 1151  TempSrc: Oral  PainSc:                  KEPHART,WILLIAM K

## 2017-03-07 NOTE — Progress Notes (Signed)
Progress Note - Cesarean Delivery  Angela MassonRachel Andrews is a 36 y.o. [phone] now PP day 1 s/p C-Section, Low Transverse .   Subjective:  Patient reports no problems with eating, bowel movements, voiding, or their wound  Objective:  Vital signs in last 24 hours: Temp:  [97.3 F (36.3 C)-97.9 F (36.6 C)] 97.6 F (36.4 C) (11/11 0926) Pulse Rate:  [55-123] 76 (11/11 0933) Resp:  [9-28] 16 (11/11 0926) BP: (120-181)/(68-122) 135/81 (11/11 0933) SpO2:  [92 %-100 %] 98 % (11/11 1015) Weight:  [181 lb (82.1 kg)] 181 lb (82.1 kg) (11/10 1128)  Physical Exam:  General: alert, cooperative, flushed and no distress Lochia: appropriate Uterine Fundus: firm Incision: Dressing intact dry DVT Evaluation: No evidence of DVT seen on physical exam.    Data Review Recent Labs    03/06/17 1134 03/06/17 1824  HGB 10.1* 10.5*  HCT 29.2* 30.6*    Assessment:  Active Problems:   Preeclampsia   PIH (pregnancy induced hypertension)   Status post Cesarean section. Doing well postoperatively.   PIH - pressures much improved  Appropriate Mg level  Plan:       Continue current care - Mg - pt OOB to chair  Consider D/C Mg tomorrow.  Likely D/C pt Tues AM    Elonda Huskyavid J. Evans, M.D. 03/07/2017 10:45 AM

## 2017-03-08 ENCOUNTER — Encounter: Payer: Self-pay | Admitting: Radiology

## 2017-03-08 ENCOUNTER — Inpatient Hospital Stay: Payer: Medicaid Other

## 2017-03-08 LAB — AST: AST: 57 U/L — AB (ref 15–41)

## 2017-03-08 LAB — RPR: RPR Ser Ql: NONREACTIVE

## 2017-03-08 LAB — ALT: ALT: 51 U/L (ref 14–54)

## 2017-03-08 MED ORDER — NIFEDIPINE ER 30 MG PO TB24
30.0000 mg | ORAL_TABLET | Freq: Two times a day (BID) | ORAL | Status: DC
  Administered 2017-03-08 – 2017-03-10 (×5): 30 mg via ORAL
  Filled 2017-03-08 (×6): qty 1

## 2017-03-08 MED ORDER — DIPHENHYDRAMINE HCL 50 MG/ML IJ SOLN
50.0000 mg | Freq: Once | INTRAMUSCULAR | Status: AC
  Administered 2017-03-08: 50 mg via INTRAVENOUS
  Filled 2017-03-08: qty 1

## 2017-03-08 MED ORDER — ONDANSETRON HCL 4 MG/2ML IJ SOLN
4.0000 mg | Freq: Once | INTRAMUSCULAR | Status: AC
  Administered 2017-03-08: 4 mg via INTRAVENOUS
  Filled 2017-03-08: qty 2

## 2017-03-08 MED ORDER — IOPAMIDOL (ISOVUE-370) INJECTION 76%
75.0000 mL | Freq: Once | INTRAVENOUS | Status: AC | PRN
  Administered 2017-03-08: 75 mL via INTRAVENOUS

## 2017-03-08 MED ORDER — MORPHINE SULFATE (PF) 4 MG/ML IV SOLN
4.0000 mg | Freq: Once | INTRAVENOUS | Status: AC
  Administered 2017-03-08: 4 mg via INTRAVENOUS
  Filled 2017-03-08: qty 1

## 2017-03-08 MED ORDER — SODIUM CHLORIDE FLUSH 0.9 % IV SOLN
INTRAVENOUS | Status: AC
  Filled 2017-03-08: qty 10

## 2017-03-08 MED ORDER — MORPHINE BOLUS VIA INFUSION: 4.0000 mg | Freq: Once | INTRAVENOUS | Status: DC

## 2017-03-08 MED ORDER — HYDROCORTISONE NA SUCCINATE PF 250 MG IJ SOLR
200.0000 mg | Freq: Once | INTRAMUSCULAR | Status: AC
  Administered 2017-03-08: 200 mg via INTRAVENOUS
  Filled 2017-03-08: qty 200

## 2017-03-08 MED ORDER — DIPHENHYDRAMINE HCL 25 MG PO CAPS
50.0000 mg | ORAL_CAPSULE | Freq: Once | ORAL | Status: AC
  Filled 2017-03-08: qty 2

## 2017-03-08 MED ORDER — LIDOCAINE 5 % EX PTCH
1.0000 | MEDICATED_PATCH | CUTANEOUS | Status: DC
  Administered 2017-03-08 – 2017-03-10 (×3): 1 via TRANSDERMAL
  Filled 2017-03-08 (×3): qty 1

## 2017-03-08 MED ORDER — HYDROCORTISONE NA SUCCINATE PF 250 MG IJ SOLR
200.0000 mg | Freq: Once | INTRAMUSCULAR | Status: DC
  Filled 2017-03-08: qty 200

## 2017-03-08 NOTE — Progress Notes (Signed)
Pt had an acute pain in right shoulder as well as pain in right rib cage as well as increased resp rate and pt c/o shortness of breath. Dr Logan BoresEvans called informed of situation and liver enzymes and CBC ordered. Labs drawn but pt continues to c/o pain and SOB. Resp rate remained 24-26 even in breaks in pain. Dr Logan BoresEvans called back and informed of no improvement in pt status. MD to bedside.

## 2017-03-08 NOTE — Progress Notes (Signed)
Patient ID: Angela MassonRachel Andrews, female   DOB: Nov 03, 1980, 36 y.o.   MRN: [id number]    Progress Note - Cesarean Delivery  Angela MassonRachel Andrews is a 36 y.o. [id number] now PP day 2 s/p C-Section, Low Transverse .   Subjective:  Patient reports no problems with eating, bowel movements, voiding, or their wound and no further episodes of chest or shoulder pain.  Feels "much better" today.  Took a shower.  Objective:  Vital signs in last 24 hours: Temp:  [97.6 F (36.4 C)-97.7 F (36.5 C)] 97.7 F (36.5 C) (11/12 0900) Pulse Rate:  [73-101] 78 (11/12 1014) Resp:  [14-26] 14 (11/12 0802) BP: (135-171)/(79-101) 157/88 (11/12 1014) SpO2:  [93 %-100 %] 98 % (11/12 1000)  Physical Exam:  General: alert, cooperative and no distress Lochia: appropriate Uterine Fundus: firm Incision: healing well DVT Evaluation: No evidence of DVT seen on physical exam.    Data Review Recent Labs    03/06/17 1824 03/07/17 2346  HGB 10.5* 9.1*  HCT 30.6* 27.4*    Assessment:  Active Problems:   Preeclampsia   PIH (pregnancy induced hypertension)   Chest pain shoulder pain resolved.  Discussed negative results of CT.  Discussed LFT's.   HTN - begun of procardia - BP's somewhat improved.  PreE - Mg stopped this moring  Status post Cesarean section. Doing well postoperatively.     Plan:       Continue current care.  I will plan to discharge her tomorrow if she continues to progress well.    Elonda Huskyavid J. Lauriana Denes, M.D. 03/08/2017 1:29 PM

## 2017-03-08 NOTE — Progress Notes (Signed)
Progress Note - Cesarean Delivery  Angela MassonRachel Andrews is a 36 y.o. [phone] 7147G4P3104 now PP day 1 s/p C-Section, Low Transverse .   Subjective:  Patient reports no problems with eating, bowel movements, voiding, or their wound and severe right-sided chest pain and right shoulder pain.  Shoulder pain worse than chest pain.   She says the pain makes it hard to take a deep breath.  Pain is intermittent.   Not epigastric.  Objective:  Vital signs in last 24 hours: Temp:  [97.6 F (36.4 C)-97.7 F (36.5 C)] 97.6 F (36.4 C) (11/11 1504) Pulse Rate:  [68-92] 87 (11/12 0032) Resp:  [9-24] 24 (11/11 2342) BP: (126-163)/(68-95) 154/86 (11/12 0032) SpO2:  [92 %-100 %] 98 % (11/12 0030)  Physical Exam:  General: alert, cooperative and mild distress Lochia: appropriate Uterine Fundus: firm Incision: healing well DVT Evaluation: No evidence of DVT - legs equal size bilaterally. Lungs clear So RUQ pain.      Data Review Recent Labs    03/06/17 1824 03/07/17 2346  HGB 10.5* 9.1*  HCT 30.6* 27.4*    Assessment:  Active Problems:   Preeclampsia   PIH (pregnancy induced hypertension)   Status post Cesarean section.      CBC unremarkable  LFT's improving.  O2 sat 99%  resp rate = 24  Hypertension now worse with the pain.   Plan:       CT to r/o PE    Pt with contrast allergy - spoke with radiology - fastest protocol is 4 hours Warner Mccreedy(Jeff Chang)      Elonda Huskyavid J. Brooklyn Alfredo, M.D. 03/08/2017 12:34 AM

## 2017-03-08 NOTE — Plan of Care (Signed)
Pt. Stable and doing well. Last BP 134/78 and denies c/o blurred vision, epigastric pain. Pt. Did report having a slight headache that she'll receive some pain medication for. Pt. Visiting newborn in [place].   Jodean LimaSolange Romulus Hanrahan, RN

## 2017-03-09 NOTE — Progress Notes (Signed)
Patient ID: Angela MassonRachel Andrews, female   DOB: 1981/01/27, 36 y.o.   MRN: [id number]     Progress Note - Cesarean Delivery  Angela MassonRachel Andrews is a 36 y.o. [id number] now PP day 2 s/p C-Section, Low Transverse .   Subjective:  Patient reports no problems with eating, bowel movements, voiding, or their wound.  Chest pain resolved.  Patient feels very tired today.  Has been able to spend very little time with her baby.  Objective:  Vital signs in last 24 hours: Temp:  [98 F (36.7 C)-98.4 F (36.9 C)] 98.2 F (36.8 C) (11/13 1122) Pulse Rate:  [68-86] 77 (11/13 1122) Resp:  [18] 18 (11/12 2344) BP: (120-139)/(67-80) 138/74 (11/13 1122) SpO2:  [94 %-99 %] 98 % (11/13 0735)  Physical Exam:  General: alert, cooperative, appears stated age, fatigued and no distress Lochia: appropriate Uterine Fundus: firm Incision: healing well, intact DVT Evaluation: No evidence of DVT seen on physical exam.    Data Review Recent Labs    03/06/17 1824 03/07/17 2346  HGB 10.5* 9.1*  HCT 30.6* 27.4*    Assessment:  Active Problems:   Preeclampsia   PIH (pregnancy induced hypertension)   Status post Cesarean section. Postoperative course complicated by Chest and shoulder pain-rule out diagnosis of PE.  Hypertension-now controlled on oral medication.     Plan:       Continue current care.  Plan discharge tomorrow.   Elonda Huskyavid J. Evans, M.D. 03/09/2017 12:27 PM

## 2017-03-10 ENCOUNTER — Encounter: Payer: Medicaid Other | Admitting: Obstetrics and Gynecology

## 2017-03-10 MED ORDER — NIFEDIPINE ER 30 MG PO TB24: 30.0000 mg | ORAL_TABLET | Freq: Two times a day (BID) | ORAL | 0 refills | Status: DC

## 2017-03-10 NOTE — Progress Notes (Signed)
Reviewed all patients discharge instructions and handouts regarding postpartum bleeding, no intercourse for 6 weeks, signs and symptoms of mastitis and postpartum bleu's. Provided patient with proper incision care and the incision kit. Instructed patient about signs and symptoms about postpartum hypertension and when to call the doctor. All questions have been answered at this time. Patient discharged via wheelchair with RN.

## 2017-03-10 NOTE — Discharge Summary (Signed)
    Physician Obstetric Discharge Summary  Patient ID: Angela MassonRachel Andrews MRN: [id number] DOB/AGE: 1980/11/25 35 y.o.   Date of Admission: 03/06/2017  Date of Discharge:   Admitting Diagnosis: Preeclampsia with severe features. at 6559w5d  Mode of Delivery: repeat cesarean section       low uterine, transverse     Discharge Diagnosis: Same with previous CD - desires repeat -tubal ligation and cesarean delivery   Intrapartum Procedures: Management of hypertension   Post partum procedures: Management of hypertension -CT scan to rule out PE (see progress notes)  Complications:                         Discharge Day SOAP Note:  Subjective:  The patient has no complaints.  She is ambulating well. She is taking PO well. Pain is well controlled with current medications. Patient is urinating without difficulty.   She is passing flatus.  No further chest pain or shoulder pain.   Objective  Vital signs in last 24 hours: BP (!) 142/82 (BP Location: Right Arm) Comment: nurse [name] notified  Pulse 76   Temp 98.2 F (36.8 C) (Oral)   Resp 18   Ht 5\' 6"  (1.676 m)   Wt 181 lb (82.1 kg)   LMP 07/13/2016   SpO2 100%   Breastfeeding? Unknown   BMI 29.21 kg/m   Physical Exam: Gen: NAD Abdomen:  clean, dry, no drainage, healing Fundus Fundal Tone: Firm  Lochia Amount: Scant     Data Review Labs: CBC Latest Ref Rng & Units 03/07/2017 03/06/2017 03/06/2017  WBC 3.6 - 11.0 K/uL 14.9(H) 9.5 7.4  Hemoglobin 12.0 - 16.0 g/dL 1.1(B9.1(L) 10.5(L) 10.1(L)  Hematocrit 35.0 - 47.0 % 27.4(L) 30.6(L) 29.2(L)  Platelets 150 - 440 K/uL 302 316 280   A POS  Assessment:  Active Problems:   Preeclampsia   PIH (pregnancy induced hypertension)   Doing well.  Normal progress as expected.    Plan:  Discharge to home  Modified rest as directed - may slowly resume normal activities with restrictions  as discussed.  Medications as written.  See below for additional.       Discharge  Instructions: Per After Visit Summary. Activity: Advance as tolerated. Pelvic rest for 6 weeks.  Also refer to After Visit Summary.  Wound care discussed. Diet: Regular Medications: Allergies as of 03/10/2017      Reactions   Iodine       Medication List    STOP taking these medications   ranitidine 150 MG tablet Commonly known as:  ZANTAC     TAKE these medications   NIFEdipine 30 MG 24 hr tablet Commonly known as:  PROCARDIA-XL/ADALAT CC Take 1 tablet (30 mg total) 2 (two) times daily by mouth.   prenatal multivitamin Tabs tablet Take 1 tablet by mouth daily at 12 noon.      Outpatient follow up:  Follow-up Information    Linzie CollinEvans, David James, MD Follow up in 1 week(s).   Specialty:  Obstetrics and Gynecology Contact information: [address] [place] [address] [phone]          Postpartum contraception: Tubal ligation  Discharged Condition: good  Discharged to: home  Newborn Data: Disposition:NICU  Apgars: APGAR (1 MIN): 6   APGAR (5 MINS): 9   APGAR (10 MINS):     Elonda Huskyavid J. Evans, M.D. 03/10/2017 10:37 AM

## 2017-03-12 ENCOUNTER — Encounter: Payer: Self-pay | Admitting: Obstetrics and Gynecology

## 2017-03-17 ENCOUNTER — Encounter: Payer: Medicaid Other | Admitting: Obstetrics and Gynecology

## 2017-03-22 ENCOUNTER — Encounter: Payer: Self-pay | Admitting: Obstetrics and Gynecology

## 2017-03-23 ENCOUNTER — Ambulatory Visit (INDEPENDENT_AMBULATORY_CARE_PROVIDER_SITE_OTHER): Payer: Medicaid Other | Admitting: Obstetrics and Gynecology

## 2017-03-23 ENCOUNTER — Encounter: Payer: Self-pay | Admitting: Obstetrics and Gynecology

## 2017-03-23 VITALS — BP 149/76 | HR 60 | Ht 66.0 in | Wt 161.4 lb

## 2017-03-23 DIAGNOSIS — Z9889 Other specified postprocedural states: Secondary | ICD-10-CM

## 2017-03-23 DIAGNOSIS — O09899 Supervision of other high risk pregnancies, unspecified trimester: Secondary | ICD-10-CM

## 2017-03-23 NOTE — Progress Notes (Signed)
HPI:      [name] is a 36 y.o. [phone] who LMP was No LMP recorded.  Subjective:   She presents today 1 week postop from cesarean delivery at 33 weeks for pregnancy-induced hypertension with severe features.  She is doing fine and has no complaints.  Has very little pain, ambulating, voiding, eating and having bowel movements without difficulty. Infant remains in the NICU but is doing well. Patient remains on her antihypertensive medication.    Hx: The following portions of the patient's history were reviewed and updated as appropriate:             She  has a past medical history of Anxiety, Blood in stool (07/2009), Depression, FHx: migraine headaches, and Vaginal itching (07/2009). She does not have any pertinent problems on file. She  has a past surgical history that includes Tympanostomy tube placement (06/1997, 11/1998, 01/2001); Wisdom tooth extraction; Cesarean section (2004 , 2005 and 2009); Cesarean section; and Cesarean section with bilateral tubal ligation (N/A, 03/06/2017). Her family history includes Arthritis in her maternal grandmother and mother; COPD in her paternal grandmother; Cancer in her maternal grandfather; Diabetes in her father, paternal aunt, and paternal uncle; Hearing loss in her maternal grandfather and mother; Hyperlipidemia in her father, paternal aunt, paternal uncle, and sister; Hypertension in her paternal aunt and paternal uncle; Liver disease in her father. She  reports that  has never smoked. she has never used smokeless tobacco. She reports that she does not drink alcohol or use drugs. She is allergic to iodine.       Review of Systems:  Review of Systems  Constitutional: Denied constitutional symptoms, night sweats, recent illness, fatigue, fever, insomnia and weight loss.  Eyes: Denied eye symptoms, eye pain, photophobia, vision change and visual disturbance.  Ears/Nose/Throat/Neck: Denied ear, nose, throat or neck symptoms, hearing loss, nasal  discharge, sinus congestion and sore throat.  Cardiovascular: Denied cardiovascular symptoms, arrhythmia, chest pain/pressure, edema, exercise intolerance, orthopnea and palpitations.  Respiratory: Denied pulmonary symptoms, asthma, pleuritic pain, productive sputum, cough, dyspnea and wheezing.  Gastrointestinal: Denied, gastro-esophageal reflux, melena, nausea and vomiting.  Genitourinary: Denied genitourinary symptoms including symptomatic vaginal discharge, pelvic relaxation issues, and urinary complaints.  Musculoskeletal: Denied musculoskeletal symptoms, stiffness, swelling, muscle weakness and myalgia.  Dermatologic: Denied dermatology symptoms, rash and scar.  Neurologic: Denied neurology symptoms, dizziness, headache, neck pain and syncope.  Psychiatric: Denied psychiatric symptoms, anxiety and depression.  Endocrine: Denied endocrine symptoms including hot flashes and night sweats.   Meds:   Current Outpatient Medications on File Prior to Visit  Medication Sig Dispense Refill  . NIFEdipine (PROCARDIA-XL/ADALAT CC) 30 MG 24 hr tablet Take 1 tablet (30 mg total) 2 (two) times daily by mouth. 60 tablet 0  . Prenatal Vit-Fe Fumarate-FA (PRENATAL MULTIVITAMIN) TABS tablet Take 1 tablet by mouth daily at 12 noon.     No current facility-administered medications on file prior to visit.     Objective:     Vitals:   03/23/17 0804  BP: (!) 149/76  Pulse: 60               Abdomen: Soft.  Non-tender.  No masses.  No HSM.  Incision/s: Intact.  Healing well.  No erythema.  No drainage.      Assessment:    [id number] Patient Active Problem List   Diagnosis Date Noted  . Preeclampsia 03/06/2017  . PIH (pregnancy induced hypertension) 03/06/2017  . Supervision of normal intrauterine pregnancy in multigravida in second trimester  10/10/2016  . AMA (advanced maternal age) multigravida 35+, second trimester 10/10/2016  . H/O cesarean section complicating pregnancy 10/10/2016     1.  Prior pregnancy complicated by PIH, antepartum, unspecified trimester   2. Post-operative state     Patient doing very well postop.  Some mild hypertension remains.   Plan:            1.  Continue antihypertensives as directed  2.  May resume many normal activities with the exception of heavy lifting and sexual activity. Orders No orders of the defined types were placed in this encounter.   No orders of the defined types were placed in this encounter.     F/U  Return in about 4 weeks (around 04/20/2017).  Elonda Huskyavid J. Keiran Gaffey, M.D. 03/23/2017 8:43 AM

## 2017-04-09 ENCOUNTER — Other Ambulatory Visit: Payer: Self-pay | Admitting: Obstetrics and Gynecology

## 2017-04-09 ENCOUNTER — Encounter: Payer: Self-pay | Admitting: Obstetrics and Gynecology

## 2017-04-09 MED ORDER — NIFEDIPINE ER 30 MG PO TB24: 30.0000 mg | ORAL_TABLET | Freq: Two times a day (BID) | ORAL | 0 refills | Status: DC

## 2017-04-09 MED ORDER — NIFEDIPINE ER 30 MG PO TB24: 30.0000 mg | ORAL_TABLET | Freq: Two times a day (BID) | ORAL | 0 refills | Status: AC

## 2017-04-26 ENCOUNTER — Ambulatory Visit: Payer: Self-pay

## 2017-04-26 ENCOUNTER — Encounter: Payer: Self-pay | Admitting: Obstetrics and Gynecology

## 2017-04-26 NOTE — Lactation Note (Signed)
This note was copied from a baby's chart. Lactation Consultation Note  Patient Name: Angela Andrews ZOXWR'UToday's Date: 04/26/2017     Maternal Data    Feeding    LATCH Score                   Interventions    Lactation Tools Discussed/Used     Consult Status      Louis MeckelWilliams, Rodric Punch Kay 04/26/2017, 5:38 PM

## 2017-04-26 NOTE — Lactation Note (Signed)
This note was copied from a baby's chart. Lactation Consultation Note  Patient Name: Basilio CairoDarby Ralph Wellman ZOXWR'UToday's Date: 04/26/2017  Mom experienced so much pain last night on right side when breast feeding that she decided to just pump.  Only pumped 1/2 of the milk she usually pumps.  Mom describes the pain on right breast as shooting, burning pain deep in the breast and not necessarily the nipple being affected.  Mom becoming tearful when describing situation during the night.  Mom's right nipple and areola are bright pink, but mom says both seem pretty pink to her. No white patches noted on breast or in baby's mouth. Right breast is hard especially on top and right side and painful to touch.  No plugged duct palpated even though mom reports having a plugged area in that same spot when baby was in SCN. No vasospasm noted when compressed and released. No galactocele, abscess or trauma noted to breast, areola or nipple.  Mom reports Darby waking at least every 3 hours to feed.  Lately he has been acting hungry in an hour after his feeding.  Sometimes she puts him back to the breast and sometimes she gives him a bottle.  When she gives him a bottle, he will take anywhere from 70 to 110ml before he is full.  He is having the same amount of voids, but is only stooling every 3rd day or so and then acts like he is constipated or in pain.  The stools look the same as they always look just not as frequent.  Mom does not think she can put Darby to the right breast right now, but agrees to pump while he breastfeeds on left breast.  Pre feeding weight with clothes on is 3038 grams (6lb 11.2oz).  Can easily hand express milk from left breast.  He opens his mouth wide with tongue down and good lateral movement.  Lip remains flanged, top and bottom.  Mom denies pinching or pain when he latches.  Pumping on right side is not painful initially.  Milk is slow to flow initially even when let down occurs.  Mom does experience pain  when massaging hard areas in breast.  After breast feeding on left breast for 20 minutes with frequent stimulatioin, Vivi FernsDarby weighs 3068 (6lb 12.2oz), only taking in 1 ounce from left breast.  Mom pumped 30ml from right breast.  Per mom, breast is a lot more comfortable and significantly softer.  Encouraged mom to call Dr. Logan BoresEvans, who is her OB, and tell him the symptoms.  He may just want to order antibiotics over the phone once she describes the symptoms or he may want to see her.  Handouts given on signs, symptoms, causes and treatments of breast and nipple pain.  Told mom to ask about a prescription for thrush if taking antibiotics giving information on signs and symptoms of thrush and recommended prevention and treatments options.  Explained importance of draining breasts with each feeding.  Mom reports still having lots of pumped milk in freezer at home.  Encouraged to call for further question, concerns or assistance.  Recommended a follow up consult after talking to OB especially if symptoms worsened or poor or no feedings.               Maternal Data    Feeding    LATCH Score                   Interventions    Lactation Tools  Discussed/Used     Consult Status      Louis MeckelWilliams, Kalis Friese Kay 04/26/2017, 4:46 PM

## 2017-04-29 ENCOUNTER — Encounter: Payer: Self-pay | Admitting: Obstetrics and Gynecology

## 2017-04-29 ENCOUNTER — Ambulatory Visit (INDEPENDENT_AMBULATORY_CARE_PROVIDER_SITE_OTHER): Payer: Medicaid Other | Admitting: Obstetrics and Gynecology

## 2017-04-29 VITALS — BP 122/79 | HR 112 | Ht 66.0 in | Wt 155.1 lb

## 2017-04-29 DIAGNOSIS — O165 Unspecified maternal hypertension, complicating the puerperium: Secondary | ICD-10-CM

## 2017-04-29 DIAGNOSIS — Z9889 Other specified postprocedural states: Secondary | ICD-10-CM

## 2017-04-29 NOTE — Progress Notes (Signed)
HPI:      [name] is a 37 y.o. [phone] who LMP was No LMP recorded.  Subjective:   She presents today 8 weeks postpartum.  She had a cesarean delivery with tubal ligation.  Earlier this week she had right breast pain and difficulty with breast-feeding from that breast.  She had a visit with the lactation consultant and since that time the pain has decreased.  She would like the breast checked however. She has not resumed intercourse. She otherwise has no complaints.  She continues to take her antihypertensive.    Hx: The following portions of the patient's history were reviewed and updated as appropriate:             She  has a past medical history of Anxiety, Blood in stool (07/2009), Depression, FHx: migraine headaches, and Vaginal itching (07/2009). She does not have any pertinent problems on file. She  has a past surgical history that includes Tympanostomy tube placement (06/1997, 11/1998, 01/2001); Wisdom tooth extraction; Cesarean section (2004 , 2005 and 2009); Cesarean section; and Cesarean section with bilateral tubal ligation (N/A, 03/06/2017). Her family history includes Arthritis in her maternal grandmother and mother; COPD in her paternal grandmother; Cancer in her maternal grandfather; Diabetes in her father, paternal aunt, and paternal uncle; Hearing loss in her maternal grandfather and mother; Hyperlipidemia in her father, paternal aunt, paternal uncle, and sister; Hypertension in her paternal aunt and paternal uncle; Liver disease in her father. She  reports that  has never smoked. she has never used smokeless tobacco. She reports that she does not drink alcohol or use drugs. She is allergic to iodine.       Review of Systems:  Review of Systems  Constitutional: Denied constitutional symptoms, night sweats, recent illness, fatigue, fever, insomnia and weight loss.  Eyes: Denied eye symptoms, eye pain, photophobia, vision change and visual disturbance.   Ears/Nose/Throat/Neck: Denied ear, nose, throat or neck symptoms, hearing loss, nasal discharge, sinus congestion and sore throat.  Cardiovascular: Denied cardiovascular symptoms, arrhythmia, chest pain/pressure, edema, exercise intolerance, orthopnea and palpitations.  Respiratory: Denied pulmonary symptoms, asthma, pleuritic pain, productive sputum, cough, dyspnea and wheezing.  Gastrointestinal: Denied, gastro-esophageal reflux, melena, nausea and vomiting.  Genitourinary: Denied genitourinary symptoms including symptomatic vaginal discharge, pelvic relaxation issues, and urinary complaints.  Musculoskeletal: Denied musculoskeletal symptoms, stiffness, swelling, muscle weakness and myalgia.  Dermatologic: Denied dermatology symptoms, rash and scar.  Neurologic: Denied neurology symptoms, dizziness, headache, neck pain and syncope.  Psychiatric: Denied psychiatric symptoms, anxiety and depression.  Endocrine: Denied endocrine symptoms including hot flashes and night sweats.   Meds:   Current Outpatient Medications on File Prior to Visit  Medication Sig Dispense Refill  . NIFEdipine (PROCARDIA-XL/ADALAT CC) 30 MG 24 hr tablet Take 1 tablet (30 mg total) by mouth 2 (two) times daily. 60 tablet 0  . Prenatal Vit-Fe Fumarate-FA (PRENATAL MULTIVITAMIN) TABS tablet Take 1 tablet by mouth daily at 12 noon.     No current facility-administered medications on file prior to visit.     Objective:     Vitals:   04/29/17 1012  BP: 122/79  Pulse: (!) 112              Breast examination reveals no erythema no pain with exam no mass. Abdomen: Soft.  Non-tender.  No masses.  No HSM.  Incision/s: Intact.  Healing well.  No erythema.  No drainage.     Pelvic examination   Pelvic:   Vulva: Normal appearance.  No lesions.  No abnormal scarring.    Vagina: No lesions or abnormalities noted.  Support: Normal pelvic support.  Urethra No masses tenderness or scarring.  Meatus Normal size without  lesions or prolapse.  Cervix: Normal ectropion.  No lesions.  Anus: Normal exam.  No lesions.  Perineum: Normal exam.  No lesions.  Healed well.          Bimanual   Uterus: Normal size.  Non-tender.  Mobile.  AV.  Adnexae: No masses.  Non-tender to palpation.  Cul-de-sac: Negative for abnormality.     Assessment:    [id number] Patient Active Problem List   Diagnosis Date Noted  . Preeclampsia 03/06/2017  . PIH (pregnancy induced hypertension) 03/06/2017  . Supervision of normal intrauterine pregnancy in multigravida in second trimester 10/10/2016  . AMA (advanced maternal age) multigravida 35+, second trimester 10/10/2016  . H/O cesarean section complicating pregnancy 10/10/2016     1. Post-operative state   2. Postpartum care and examination immediately after delivery   3. Postpartum hypertension     Blood pressure good today.  Patient doing well postop and postpartum.  I reassured her regarding her breast pain from earlier this week-no signs of mastitis.   Plan:            1.  Stop antihypertensive-recheck blood pressure in 2 weeks.  Consider referral if elevated.  2.  Patient may resume normal activities. Orders No orders of the defined types were placed in this encounter.   No orders of the defined types were placed in this encounter.     F/U  Return in about 2 weeks (around 05/13/2017).  Elonda Huskyavid J. Naveah Brave, M.D. 04/29/2017 10:56 AM

## 2017-05-05 ENCOUNTER — Encounter: Payer: Self-pay | Admitting: Obstetrics and Gynecology

## 2017-05-18 ENCOUNTER — Ambulatory Visit (INDEPENDENT_AMBULATORY_CARE_PROVIDER_SITE_OTHER): Payer: Medicaid Other | Admitting: Obstetrics and Gynecology

## 2017-05-18 VITALS — BP 120/82 | HR 88

## 2017-05-18 DIAGNOSIS — O165 Unspecified maternal hypertension, complicating the puerperium: Secondary | ICD-10-CM | POA: Diagnosis not present

## 2017-05-18 NOTE — Progress Notes (Signed)
Pt is here for BP check, she is doing well, BP today is 120/82, states she has a few headaches, however nothing that is not relieved by tylenol

## 2017-06-05 ENCOUNTER — Other Ambulatory Visit: Payer: Self-pay | Admitting: Obstetrics and Gynecology

## 2017-06-16 ENCOUNTER — Other Ambulatory Visit: Payer: Self-pay

## 2017-06-22 ENCOUNTER — Encounter: Payer: Self-pay | Admitting: Obstetrics and Gynecology

## 2017-06-22 ENCOUNTER — Ambulatory Visit (INDEPENDENT_AMBULATORY_CARE_PROVIDER_SITE_OTHER): Payer: Medicaid Other | Admitting: Obstetrics and Gynecology

## 2017-06-22 VITALS — BP 132/90 | HR 91 | Ht 66.0 in | Wt 161.4 lb

## 2017-06-22 DIAGNOSIS — Z0000 Encounter for general adult medical examination without abnormal findings: Secondary | ICD-10-CM

## 2017-06-22 NOTE — Progress Notes (Signed)
HPI:      [name] is a 37 y.o. [phone] who LMP was No LMP recorded.  Subjective:   She presents today for her annual examination.  She has no complaints.  She feels well. She has a tubal ligation for birth control.    Hx: The following portions of the patient's history were reviewed and updated as appropriate:             She  has a past medical history of Anxiety, Blood in stool (07/2009), Depression, FHx: migraine headaches, and Vaginal itching (07/2009). She does not have any pertinent problems on file. She  has a past surgical history that includes Tympanostomy tube placement (06/1997, 11/1998, 01/2001); Wisdom tooth extraction; Cesarean section (2004 , 2005 and 2009); Cesarean section; and Cesarean section with bilateral tubal ligation (N/A, 03/06/2017). Her family history includes Arthritis in her maternal grandmother and mother; COPD in her paternal grandmother; Cancer in her maternal grandfather; Diabetes in her father, paternal aunt, and paternal uncle; Hearing loss in her maternal grandfather and mother; Hyperlipidemia in her father, paternal aunt, paternal uncle, and sister; Hypertension in her paternal aunt and paternal uncle; Liver disease in her father. She  reports that  has never smoked. she has never used smokeless tobacco. She reports that she does not drink alcohol or use drugs. She has a current medication list which includes the following prescription(s): loratadine, prenatal multivitamin, and nifedipine. She is allergic to iodine.       Review of Systems:  Review of Systems  Constitutional: Denied constitutional symptoms, night sweats, recent illness, fatigue, fever, insomnia and weight loss.  Eyes: Denied eye symptoms, eye pain, photophobia, vision change and visual disturbance.  Ears/Nose/Throat/Neck: Denied ear, nose, throat or neck symptoms, hearing loss, nasal discharge, sinus congestion and sore throat.  Cardiovascular: Denied cardiovascular symptoms,  arrhythmia, chest pain/pressure, edema, exercise intolerance, orthopnea and palpitations.  Respiratory: Denied pulmonary symptoms, asthma, pleuritic pain, productive sputum, cough, dyspnea and wheezing.  Gastrointestinal: Denied, gastro-esophageal reflux, melena, nausea and vomiting.  Genitourinary: Denied genitourinary symptoms including symptomatic vaginal discharge, pelvic relaxation issues, and urinary complaints.  Musculoskeletal: Denied musculoskeletal symptoms, stiffness, swelling, muscle weakness and myalgia.  Dermatologic: Denied dermatology symptoms, rash and scar.  Neurologic: Denied neurology symptoms, dizziness, headache, neck pain and syncope.  Psychiatric: Denied psychiatric symptoms, anxiety and depression.  Endocrine: Denied endocrine symptoms including hot flashes and night sweats.   Meds:   Current Outpatient Medications on File Prior to Visit  Medication Sig Dispense Refill  . loratadine (CLARITIN) 10 MG tablet Take 10 mg by mouth daily.    . Prenatal Vit-Fe Fumarate-FA (PRENATAL MULTIVITAMIN) TABS tablet Take 1 tablet by mouth daily at 12 noon.    Marland Kitchen. NIFEdipine (PROCARDIA-XL/ADALAT CC) 30 MG 24 hr tablet Take 1 tablet (30 mg total) by mouth 2 (two) times daily. (Patient not taking: Reported on 05/18/2017) 60 tablet 0   No current facility-administered medications on file prior to visit.     Objective:     Vitals:   06/22/17 1052  BP: 132/90  Pulse: 91              Physical examination General NAD, Conversant  HEENT Atraumatic; Op clear with mmm.  Normo-cephalic. Pupils reactive. Anicteric sclerae  Thyroid/Neck Smooth without nodularity or enlargement. Normal ROM.  Neck Supple.  Skin No rashes, lesions or ulceration. Normal palpated skin turgor. No nodularity.  Breasts: No masses or discharge.  Symmetric.  No axillary adenopathy.  Lungs: Clear to auscultation.No rales  or wheezes. Normal Respiratory effort, no retractions.  Heart: NSR.  No murmurs or rubs  appreciated. No periferal edema  Abdomen: Soft.  Non-tender.  No masses.  No HSM. No hernia  Extremities: Moves all appropriately.  Normal ROM for age. No lymphadenopathy.  Neuro: Oriented to PPT.  Normal mood. Normal affect.     Pelvic:   Vulva: Normal appearance.  No lesions.  Vagina: No lesions or abnormalities noted.  Support: Normal pelvic support.  Urethra No masses tenderness or scarring.  Meatus Normal size without lesions or prolapse.  Cervix: Normal appearance.  No lesions.  Anus: Normal exam.  No lesions.  Perineum: Normal exam.  No lesions.        Bimanual   Uterus: Normal size.  Non-tender.  Mobile.  AV.  Adnexae: No masses.  Non-tender to palpation.  Cul-de-sac: Negative for abnormality.   Initial blood pressure mildly elevated but repeat manual equals 118/84.   Assessment:    [id number] Patient Active Problem List   Diagnosis Date Noted  . Preeclampsia 03/06/2017  . PIH (pregnancy induced hypertension) 03/06/2017  . Supervision of normal intrauterine pregnancy in multigravida in second trimester 10/10/2016  . AMA (advanced maternal age) multigravida 35+, second trimester 10/10/2016  . H/O cesarean section complicating pregnancy 10/10/2016     1. Encounter for annual physical exam     Patient doing well without problem.  Had a Pap smear less than one year ago.   Plan:            1.  Follow-up in 1 year for annual exam and Pap. Orders No orders of the defined types were placed in this encounter.   No orders of the defined types were placed in this encounter.       F/U  Return in about 1 year (around 06/22/2018) for Annual Physical.  Elonda Husky, M.D. 06/22/2017 11:21 AM

## 2019-02-16 IMAGING — CT CT ANGIO CHEST
2 of 6 series · 18 of 46 positions shown · IV contrast (APPLIED)
Comparison: None.

CLINICAL DATA: Acute onset of shortness of breath. Increased
respiratory rate. Right shoulder and rib pain. Status post recent
C-section.

EXAM:
CT ANGIOGRAPHY CHEST WITH CONTRAST
TECHNIQUE: Multidetector CT imaging of the chest was performed using the
standard protocol during bolus administration of intravenous
contrast. Multiplanar CT image reconstructions and MIPs were
obtained to evaluate the vascular anatomy.
CONTRAST:  75mL KOHUNJ-9T2 IOPAMIDOL (KOHUNJ-9T2) INJECTION 76%

[Series 9: thins · axial · 0.64mm/px · z∈[-82,+197]mm · 15 of 307 slices shown]
[im 14/307  lung]
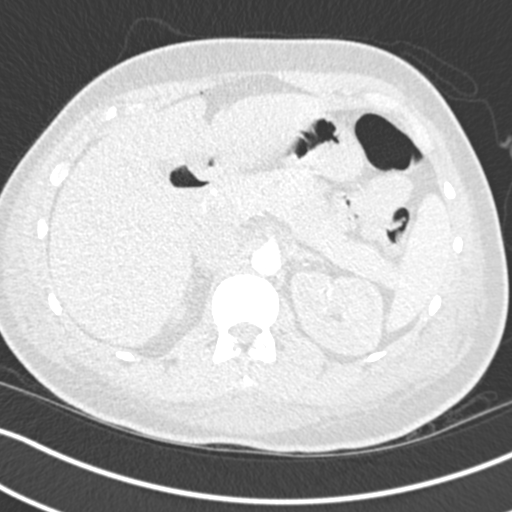
[im 40/307  soft-tissue]
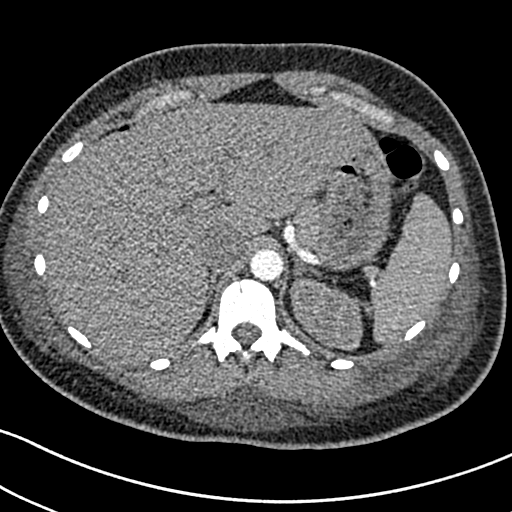
[im 54/307  lung]
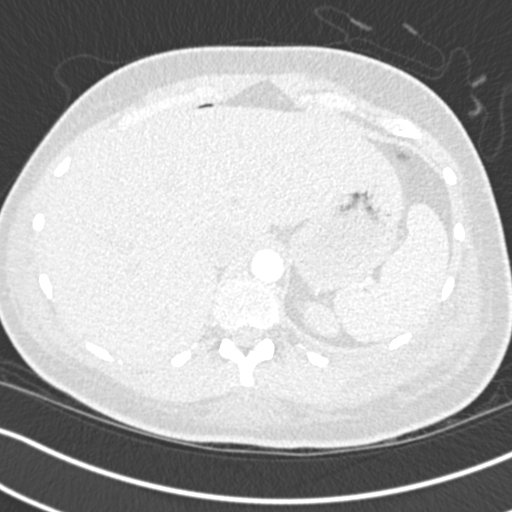
[im 80/307  soft-tissue]
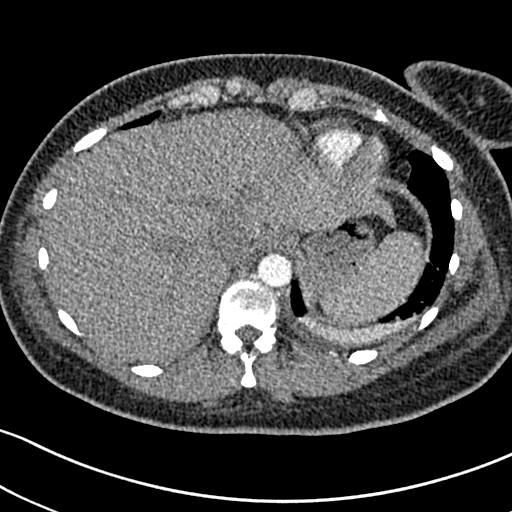
[im 94/307  lung]
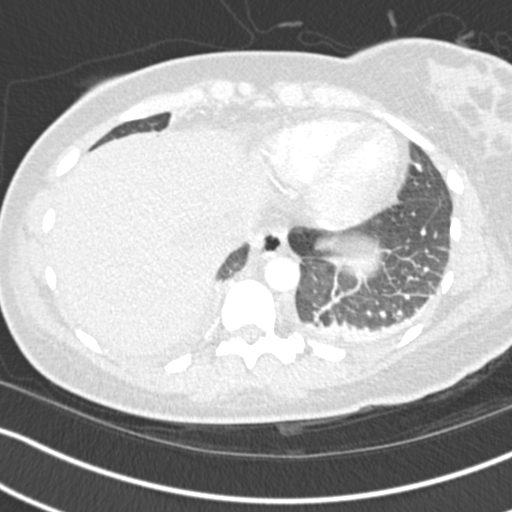
[im 120/307  soft-tissue]
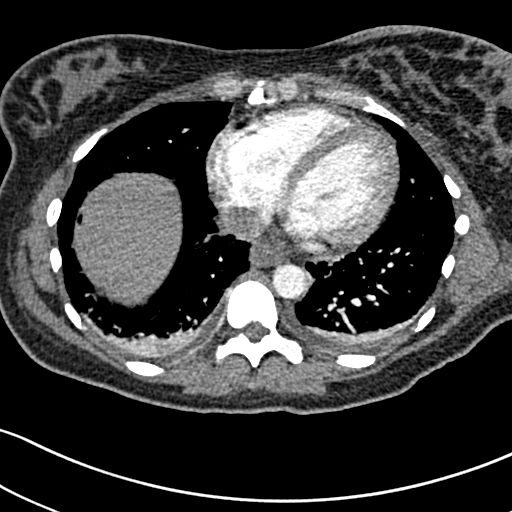
[im 134/307  lung]
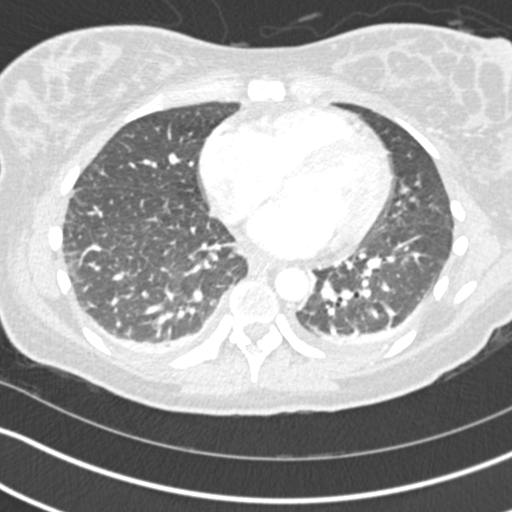
[im 160/307  soft-tissue]
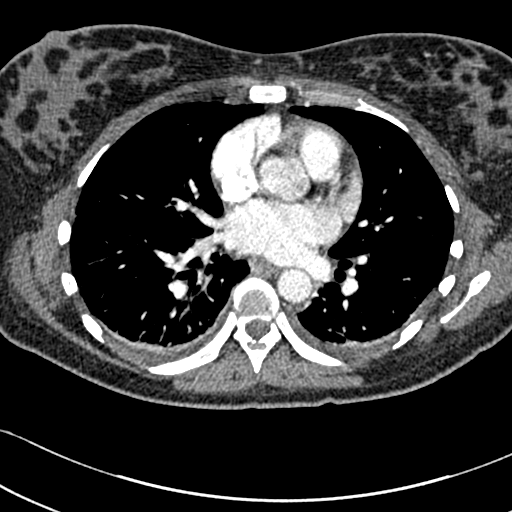
[im 173/307  lung]
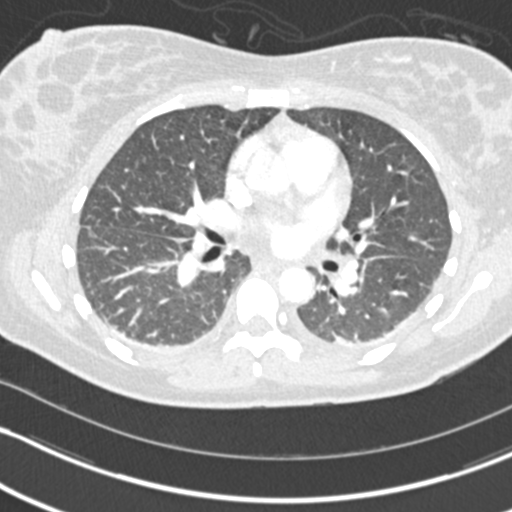
[im 187/307  soft-tissue]
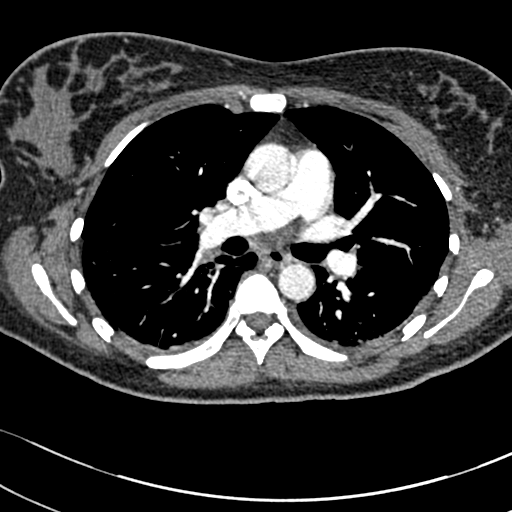
[im 213/307  lung]
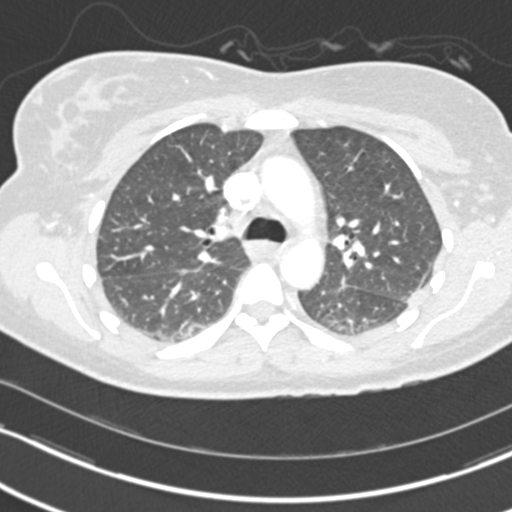
[im 227/307  soft-tissue]
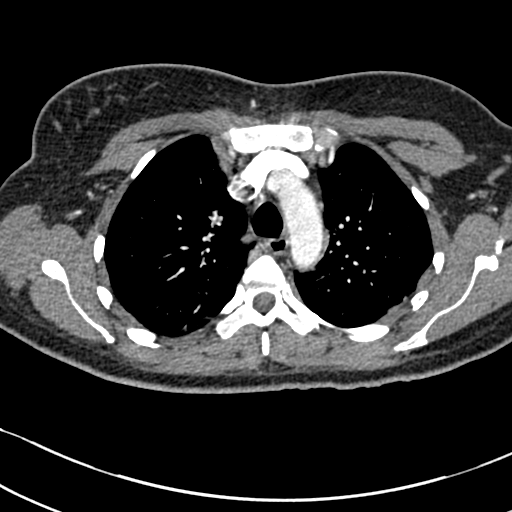
[im 253/307  lung]
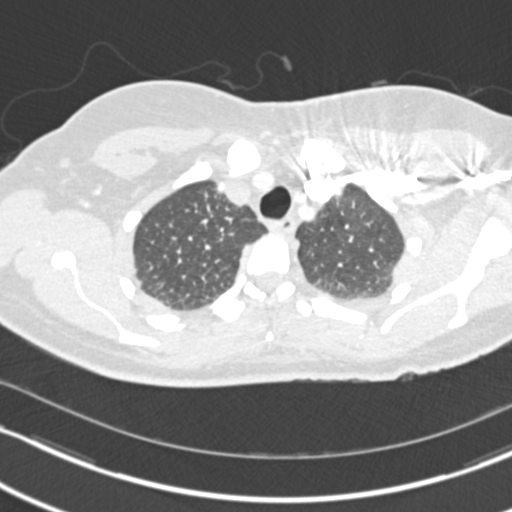
[im 267/307  soft-tissue]
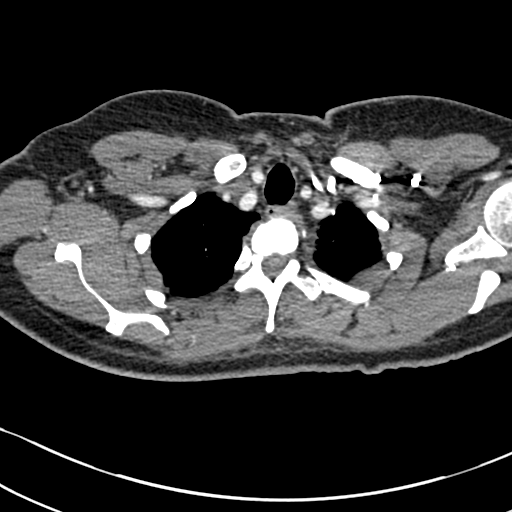
[im 293/307  lung]
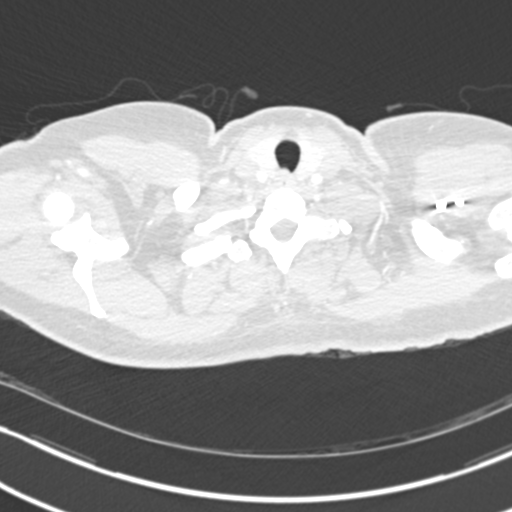

[Series 11: coronal mpr · coronal · 0.60mm/px · 3 of 83 slices shown]
[im 21/83  soft-tissue]
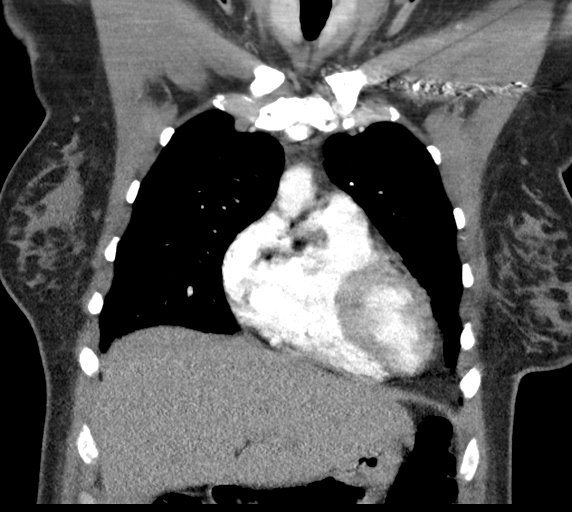
[im 42/83  soft-tissue]
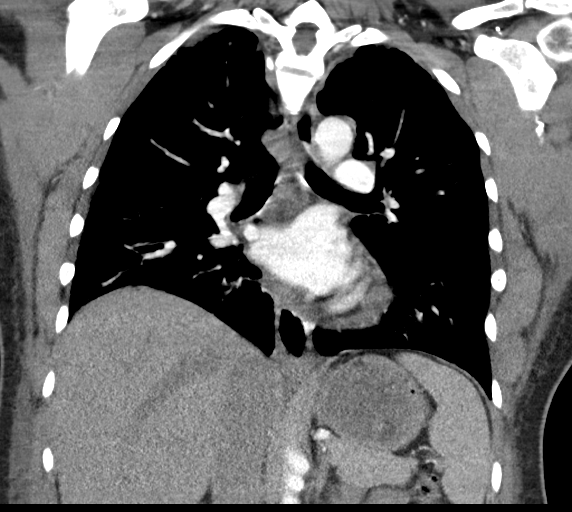
[im 62/83  soft-tissue]
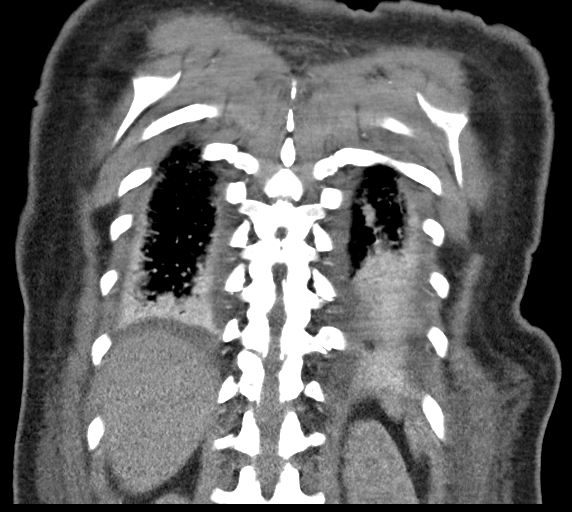

[18 of 46 positions shown; findings below may reference images not displayed]

FINDINGS: Cardiovascular:  There is no evidence of pulmonary embolus.

The heart is normal in size. The thoracic aorta is unremarkable. The
great vessels are within normal limits.

Mediastinum/Nodes: The mediastinum is unremarkable in appearance. No
definite mediastinal lymphadenopathy is seen. No pericardial
effusion is identified. The subcarinal region is difficult to fully
assess. The visualized portions of the thyroid gland are
unremarkable. No axillary lymphadenopathy is seen.

Lungs/Pleura: Trace bilateral pleural effusions are noted, with
associated bibasilar atelectasis. There is slight interstitial
prominence, which may reflect minimal interstitial edema. No
pneumothorax is seen. No masses are identified.

Upper Abdomen: Scattered intraperitoneal air reflects recent
C-section. The visualized portions of the liver and spleen are
unremarkable. The patient is status post cholecystectomy, with clips
noted at the gallbladder fossa. The visualized portions of the
pancreas, adrenal glands and kidneys are within normal limits.

Musculoskeletal: No acute osseous abnormalities are identified.
Sclerosis at vertebral body C7 is nonspecific. The visualized
musculature is unremarkable in appearance.

Review of the MIP images confirms the above findings.
IMPRESSION: 1. No evidence of pulmonary embolus.
2. Trace bilateral pleural effusions, with associated bibasilar
atelectasis. Slight interstitial prominence may reflect minimal
interstitial edema.
3. Nonspecific sclerosis of vertebral body C7. Would correlate for
any associated symptoms. No additional focal sclerosis seen along
the thoracic spine.
